# Patient Record
Sex: Female | Born: 1990
Health system: Southern US, Community
[De-identification: ages and names within clinical notes are randomized; demographics above are authoritative.]

## PROBLEM LIST (undated history)

## (undated) DIAGNOSIS — K219 Gastro-esophageal reflux disease without esophagitis: Secondary | ICD-10-CM

## (undated) DIAGNOSIS — F419 Anxiety disorder, unspecified: Secondary | ICD-10-CM

## (undated) HISTORY — PX: WISDOM TOOTH EXTRACTION: SHX21

## (undated) HISTORY — DX: Anxiety disorder, unspecified: F41.9

## (undated) HISTORY — PX: CHOLECYSTECTOMY: SHX55

## (undated) HISTORY — PX: ADENOIDECTOMY: SUR15

## (undated) HISTORY — PX: OTHER SURGICAL HISTORY: SHX169

## (undated) HISTORY — DX: Gastro-esophageal reflux disease without esophagitis: K21.9

---
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
## 2012-03-06 ENCOUNTER — Ambulatory Visit: Payer: Self-pay | Admitting: Family

## 2012-03-06 LAB — COMPREHENSIVE METABOLIC PANEL
Alkaline Phosphatase: 57 U/L — ABNORMAL LOW (ref 82–169)
Anion Gap: 5 — ABNORMAL LOW (ref 7–16)
Bilirubin,Total: 0.4 mg/dL (ref 0.2–1.0)
Calcium, Total: 9 mg/dL (ref 9.0–10.7)
Co2: 28 mmol/L (ref 21–32)
EGFR (African American): >60
Glucose: 75 mg/dL (ref 65–99)
Osmolality: 279 (ref 275–301)
Potassium: 3.8 mmol/L (ref 3.5–5.1)
SGOT(AST): 18 U/L (ref 0–26)
SGPT (ALT): 17 U/L (ref 12–78)
Total Protein: 8.1 g/dL (ref 6.4–8.6)

## 2012-03-06 LAB — CBC WITH DIFFERENTIAL/PLATELET
Basophil %: 0.5 %
Eosinophil #: 0.1 10*3/uL (ref 0.0–0.7)
Eosinophil %: 0.9 %
Lymphocyte #: 2.8 10*3/uL (ref 1.0–3.6)
Lymphocyte %: 36.8 %
MCH: 31.1 pg (ref 26.0–34.0)
MCHC: 34.1 g/dL (ref 32.0–36.0)
MCV: 91 fL (ref 80–100)
Neutrophil %: 56.5 %
RDW: 12.6 % (ref 11.5–14.5)
WBC: 7.5 10*3/uL (ref 3.6–11.0)

## 2012-03-18 ENCOUNTER — Encounter: Payer: Self-pay | Admitting: Gynecology

## 2012-03-19 ENCOUNTER — Ambulatory Visit (INDEPENDENT_AMBULATORY_CARE_PROVIDER_SITE_OTHER): Payer: BC Managed Care – PPO | Admitting: Gynecology

## 2012-03-19 ENCOUNTER — Encounter: Payer: Self-pay | Admitting: Gynecology

## 2012-03-19 VITALS — BP 110/60 | Ht 64.0 in | Wt 116.0 lb

## 2012-03-19 DIAGNOSIS — N946 Dysmenorrhea, unspecified: Secondary | ICD-10-CM

## 2012-03-19 DIAGNOSIS — K219 Gastro-esophageal reflux disease without esophagitis: Secondary | ICD-10-CM | POA: Insufficient documentation

## 2012-03-19 LAB — CBC WITH DIFFERENTIAL/PLATELET
Basophils Absolute: 0 10*3/uL (ref 0.0–0.1)
Basophils Relative: 0 % (ref 0–1)
Eosinophils Absolute: 0.1 10*3/uL (ref 0.0–0.7)
MCH: 30.3 pg (ref 26.0–34.0)
MCHC: 33.4 g/dL (ref 30.0–36.0)
Monocytes Relative: 6 % (ref 3–12)
Neutrophils Relative %: 56 % (ref 43–77)
Platelets: 278 10*3/uL (ref 150–400)
RDW: 13 % (ref 11.5–15.5)

## 2012-03-19 NOTE — Patient Instructions (Signed)
Follow up for ultrasound as scheduled 

## 2012-03-19 NOTE — Progress Notes (Signed)
Patient presents with her mother to discuss her dysmenorrhea. Has been having a lot of cramping with her menses which is leaving her bed bound. Has tried Advil does not seem to help. Has been on multiple different birth control pills per her history through school and again this does not seem to help with her cramping. Her menses are regular lasting 7 days but having a lot of help the cramping with them. No intermenstrual bleeding or intermenstrual pain. Otherwise doing well from a health standpoint. Was evaluated number of years ago for cramping with ultrasound 2010 normal. Remains virginal, does use tampons for her menses.  Exam with kim assistant Blood pressure 110/60 weight 116 height 5 foot 4 inches HEENT normal Lungs clear  Cardiac regular rate without rubs murmurs or gallops Breast exam deferred with reportedly no issues per patient  Abdomen soft nontender without masses guarding rebound organomegaly Spine straight without CVA tenderness Pelvic external BUS vagina normal. Cervix grossly normal. Uterus retroverted normal size midline mobile nontender. Adnexa without masses or tenderness. Rectovaginal exam normal.  Assessment and plan: 22 year old virgin with significant dysmenorrhea stable over time. No intermenstrual pain or bleeding. Has tried several different birth control pills without benefit. Recommend start with CBC and von Willebrand's panel given the moderate flow just to make sure although she has no history of easy bruisability or gums bleeding with brushing.  Check baseline ultrasound rule out ovarian process or ovarian endometriomas. Options for management reviewed to include more aggressive pain medicine, extended birth control pill cycles such as every 3-6 month withdrawal, progesterone only like Depo-Provera, Depo-Lupron, Mirena IUD or Skyla, diagnostic laparoscopy. The pros/cons of each choice reviewed. Patient will think of her options and follow up for the ultrasound and we'll  go from there.   Also reviewed her chart and she received 2 Gardasil vaccines and 2010. Options to receive the third now recognizing suboptimal interval or restart in the series discussed and offered. Patient wants to think and will follow up at her choice.

## 2012-03-21 LAB — VON WILLEBRAND PANEL: Coagulation Factor VIII: 111 % (ref 73–140)

## 2012-03-24 ENCOUNTER — Encounter: Payer: Self-pay | Admitting: Gynecology

## 2012-03-27 ENCOUNTER — Other Ambulatory Visit: Payer: BC Managed Care – PPO

## 2012-03-27 ENCOUNTER — Ambulatory Visit: Payer: BC Managed Care – PPO | Admitting: Gynecology

## 2012-04-01 ENCOUNTER — Ambulatory Visit (INDEPENDENT_AMBULATORY_CARE_PROVIDER_SITE_OTHER): Payer: BC Managed Care – PPO

## 2012-04-01 ENCOUNTER — Other Ambulatory Visit: Payer: Self-pay | Admitting: Gynecology

## 2012-04-01 ENCOUNTER — Encounter: Payer: Self-pay | Admitting: Gynecology

## 2012-04-01 ENCOUNTER — Ambulatory Visit (INDEPENDENT_AMBULATORY_CARE_PROVIDER_SITE_OTHER): Payer: BC Managed Care – PPO | Admitting: Gynecology

## 2012-04-01 DIAGNOSIS — B009 Herpesviral infection, unspecified: Secondary | ICD-10-CM

## 2012-04-01 DIAGNOSIS — N946 Dysmenorrhea, unspecified: Secondary | ICD-10-CM

## 2012-04-01 DIAGNOSIS — B001 Herpesviral vesicular dermatitis: Secondary | ICD-10-CM

## 2012-04-01 DIAGNOSIS — N8300 Follicular cyst of ovary, unspecified side: Secondary | ICD-10-CM

## 2012-04-01 MED ORDER — NORETHINDRONE ACET-ETHINYL EST 1-20 MG-MCG PO TABS: 1.0000 | ORAL_TABLET | Freq: Every day | ORAL | Status: DC

## 2012-04-01 MED ORDER — ACYCLOVIR 200 MG/5ML PO SUSP: 400.0000 mg | Freq: Every day | ORAL | Status: DC

## 2012-04-01 NOTE — Progress Notes (Signed)
Patient presents in followup for ultrasound. History of dysmenorrhea on oral contraceptives. Also with frequent recurrent cold sores.  Ultrasound shows uterus normal size and echotexture. Endometrial echo 9.3 mm. Right and left ovaries visualized and normal. Cul-de-sac negative.  Assessment and plan: 1. Dysmenorrhea. Many options reviewed with the patient as outlined in my 03/19/2012 note to include more aggressive pain medicine, extended birth control pill cycles such as every 3-6 month withdrawal, progesterone only like Depo-Provera, Depo-Lupron, Mirena IUD or Skyla, diagnostic laparoscopy.  Patient would like to try every 3 month withdrawal on oral contraceptives. She's unsure of which one she took before as she has been on multiple types some of which seemed to make her cold sores worse. We'll start with Loestrin 120 equivalents and see how she does with that. Yasmin type also discussed as a possibility. I reviewed possible increased risk of blood clots to include stroke heart attack DVT.  If Loestrin 120 do not work then we may want to try this. 2. Recurrent herpes labialis. Options for management reviewed. She cannot swallow big pills. Will try acyclovir 400 mg liquid daily to see if she doesn't suppress with this. She will stay on this for 6 months and we'll go from there.

## 2012-04-01 NOTE — Patient Instructions (Signed)
Start on birth control pills continuously with every three-month withdrawal. Call me for any issues with this. Start on acyclovir 2 teaspoons daily for cold sore suppression. Continue this daily and we'll see if this stops the frequent cold sores. Call me if you have any questions with this.

## 2012-04-24 ENCOUNTER — Emergency Department (HOSPITAL_COMMUNITY)
Admission: EM | Admit: 2012-04-24 | Discharge: 2012-04-24 | Disposition: A | Discharge status: Home / Self Care | Payer: BC Managed Care – PPO | Attending: Emergency Medicine | Admitting: Emergency Medicine

## 2012-04-24 DIAGNOSIS — S40012A Contusion of left shoulder, initial encounter: Secondary | ICD-10-CM

## 2012-04-24 DIAGNOSIS — J860 Pyothorax with fistula: Secondary | ICD-10-CM | POA: Insufficient documentation

## 2012-04-24 DIAGNOSIS — Y9289 Other specified places as the place of occurrence of the external cause: Secondary | ICD-10-CM | POA: Insufficient documentation

## 2012-04-24 DIAGNOSIS — Y9301 Activity, walking, marching and hiking: Secondary | ICD-10-CM | POA: Insufficient documentation

## 2012-04-24 DIAGNOSIS — IMO0002 Reserved for concepts with insufficient information to code with codable children: Secondary | ICD-10-CM | POA: Insufficient documentation

## 2012-04-24 DIAGNOSIS — S40019A Contusion of unspecified shoulder, initial encounter: Secondary | ICD-10-CM | POA: Insufficient documentation

## 2012-04-24 DIAGNOSIS — Z79899 Other long term (current) drug therapy: Secondary | ICD-10-CM | POA: Insufficient documentation

## 2012-04-24 MED ORDER — IBUPROFEN 100 MG/5ML PO SUSP
10.0000 mg/kg | Freq: Once | ORAL | Status: AC
  Administered 2012-04-24: 544 mg via ORAL
  Filled 2012-04-24: qty 30
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
## 2012-06-18 ENCOUNTER — Ambulatory Visit: Payer: Self-pay | Admitting: Gastroenterology

## 2012-06-24 DIAGNOSIS — K828 Other specified diseases of gallbladder: Secondary | ICD-10-CM | POA: Insufficient documentation

## 2012-07-06 ENCOUNTER — Telehealth: Payer: Self-pay | Admitting: *Deleted

## 2012-07-06 MED ORDER — DROSPIRENONE-ETHINYL ESTRADIOL 3-0.02 MG PO TABS: 1.0000 | ORAL_TABLET | Freq: Every day | ORAL | Status: DC

## 2012-07-06 NOTE — Telephone Encounter (Signed)
Recommend trial of Yaz. We discussed this previously with possible slight increased risk of stroke heart attack DVT. Refill times next annual see how she does with this.

## 2012-07-06 NOTE — Telephone Encounter (Signed)
Pt informed, rx sent 

## 2012-07-06 NOTE — Telephone Encounter (Signed)
Pt was given Rx for junel 1/20, she has taking for months now and said that it is causing her to have fever blisters. Pt was told to call and let you know if this should occur and new rx would be given. Please advise

## 2012-07-15 ENCOUNTER — Telehealth: Payer: Self-pay | Admitting: *Deleted

## 2012-07-15 MED ORDER — ACYCLOVIR 200 MG/5ML PO SUSP: 400.0000 mg | Freq: Every day | ORAL | Status: DC

## 2012-07-15 NOTE — Telephone Encounter (Signed)
Pt calling requesting refill on acyclovir 400 mg liquid daily, still having frequent outbreaks. Pt is up to date on annual, rx will be sent to pharmacy.

## 2012-07-28 ENCOUNTER — Telehealth: Payer: Self-pay | Admitting: *Deleted

## 2012-07-28 NOTE — Telephone Encounter (Signed)
PT CALLED TO FOLLOW UP WITH TELEPHONE ENCOUNTER 6//9/14. YAZ IS CAUSING FEVER BLISTER AS WELL, PT ASKED ANY OTHER OPTIONS FOR PILLS? PLEASE ADVISE

## 2012-07-28 NOTE — Telephone Encounter (Signed)
PT SAID SHE TAKES LIQUID ACYCLOVIR DAILY AND STILL THE SAME ISSUE. PT WOULD LIKE TO MAKE OV WITH TF TO DISCUSS OTHER OPTIONS. TRANSFERRED TO FRONT DESK TO SCHEDULE.

## 2012-07-28 NOTE — Telephone Encounter (Signed)
I am not sure that the pills are related to this. It would be an unusual side effect of the birth control pills. I'm a little reluctant to keep switching around. I would recommend staying on the acyclovir 400 mg daily as a suppression to see if we can keep her from having the cold sores.

## 2012-08-10 ENCOUNTER — Ambulatory Visit: Payer: Self-pay | Admitting: Gynecology

## 2012-08-17 ENCOUNTER — Ambulatory Visit (INDEPENDENT_AMBULATORY_CARE_PROVIDER_SITE_OTHER): Payer: BC Managed Care – PPO | Admitting: Gynecology

## 2012-08-17 ENCOUNTER — Encounter: Payer: Self-pay | Admitting: Gynecology

## 2012-08-17 DIAGNOSIS — B009 Herpesviral infection, unspecified: Secondary | ICD-10-CM

## 2012-08-17 DIAGNOSIS — B001 Herpesviral vesicular dermatitis: Secondary | ICD-10-CM

## 2012-08-17 DIAGNOSIS — Z309 Encounter for contraceptive management, unspecified: Secondary | ICD-10-CM

## 2012-08-17 MED ORDER — ACYCLOVIR 200 MG/5ML PO SUSP: 400.0000 mg | Freq: Every day | ORAL | Status: DC

## 2012-08-17 NOTE — Patient Instructions (Signed)
Try increasing the acyclovir to 2 teaspoons twice daily when you had the earliest onset of symptoms. We'll see if this does not suppress your cold sores.

## 2012-08-17 NOTE — Progress Notes (Signed)
Patient presents to discuss her recurrent herpes labialis. Had started on Loestrin 120 menstrual suppression for dysmenorrhea/menorrhagia and has done well but noted an increased rate of cold sores. We switched her to Dianah Field but she continues to have monthly outbreaks. She is having a great response though from a menstrual suppression standpoint is doing well. I reviewed options to include switching regiments such as Depo-Provera/nexplanon/Depo-Lupron/IUD. The pros/cons, risks/benefits of each choice reviewed in her particular situation.  At this point since she has had such a great response to the oral contraceptives we'll continue with the Yaz and adjust her acyclovir. Options to increase to 600 mg daily or stay at 400 mg daily suppression and increased to 800 mg with earliest signs of outbreak discussed. She is going to try continuing the 400 mg daily and go to 800 mg at the earliest sign of outbreak for several days. This gives her an abortive acceptable outbreak and we'll monitor. If not then we may go up to 600 mg daily possible adjust her pills to a 30-35 mcg dose. Patient will follow up with me as needed.

## 2012-11-25 ENCOUNTER — Other Ambulatory Visit: Payer: Self-pay | Admitting: Gynecology

## 2013-01-12 ENCOUNTER — Telehealth: Payer: Self-pay | Admitting: *Deleted

## 2013-01-12 NOTE — Telephone Encounter (Signed)
Pt is currently taking generic yaz continuously x3 months c/o having mood swings & anxious between 2nd and 3rd pack. Other than mood swings pill are working great. She would like to keep taking pill every 3 months, but asked if there is something she could take for this? Any recommendations? Please advise

## 2013-01-12 NOTE — Telephone Encounter (Signed)
Pt informed with the below note, pt will try withdrawal every other month.

## 2013-01-12 NOTE — Telephone Encounter (Signed)
Options: 1. Put up with it 2. Having every other month withdrawal to see if this does eliminate the symptoms 3. Try different low-dose birth control pill and continue with every 3 month withdrawal 4. Do not feel adding an anxiolytic for this indication appropriate.

## 2013-02-26 ENCOUNTER — Telehealth: Payer: Self-pay | Admitting: *Deleted

## 2013-02-26 NOTE — Telephone Encounter (Signed)
Pt is currently on Yaz takes 2 packs continuously and then on 3rd pack have cycle. Pt has a terrible cold taking amoxicillin, allegra, takes Zoloft as well. Pt is in her 2nd pack of pill first week and cycle has started has not missed any pills. Asked if she should just continue taking pills? Could this be due to all the medication she is taking? Please advise

## 2013-02-26 NOTE — Telephone Encounter (Signed)
Possibly due to antibiotic. Regardless recommend stopping now and restart next Sunday. Backup contraception over the next cycle at least. Should be using condoms regardless to help decrease STD risk.

## 2013-02-26 NOTE — Telephone Encounter (Signed)
Pt informed with the below note. 

## 2013-04-01 ENCOUNTER — Encounter: Payer: Self-pay | Admitting: Gynecology

## 2013-04-01 ENCOUNTER — Ambulatory Visit (INDEPENDENT_AMBULATORY_CARE_PROVIDER_SITE_OTHER): Payer: BC Managed Care – PPO | Admitting: Gynecology

## 2013-04-01 DIAGNOSIS — N926 Irregular menstruation, unspecified: Secondary | ICD-10-CM

## 2013-04-01 DIAGNOSIS — N898 Other specified noninflammatory disorders of vagina: Secondary | ICD-10-CM

## 2013-04-01 LAB — WET PREP FOR TRICH, YEAST, CLUE: Trich, Wet Prep: NONE SEEN

## 2013-04-01 MED ORDER — FLUCONAZOLE 150 MG PO TABS: 150.0000 mg | ORAL_TABLET | Freq: Once | ORAL | Status: DC

## 2013-04-01 NOTE — Addendum Note (Signed)
Addended by: Richardson ChiquitoWILKINSON, KARI S on: 04/01/2013 03:00 PM   Modules accepted: Orders

## 2013-04-01 NOTE — Progress Notes (Signed)
Nicole PeperJoanna R Richardson [birth year] [id number]        23 y.o.  G0P0 presents complaining of irregular menses. Patient has been doing well with regular monthly menses up until about a month ago when she started to have some breakthrough bleeding midcycle, stopped her pills had a flow we started them had another episode of breakthrough bleeding and she stopped her pills again such that now she's having periods every 2 weeks. Not having any pain. Nothing else has changed. Was seen earlier this year with recurrent HSV which seems to have resolved with much less frequent episodes. Currently on Yaz BCPs. Virginal using BCPs for menstrual regulation.  Past medical history,surgical history, problem list, medications, allergies, family history and social history were all reviewed and documented in the EPIC chart.  Exam: [facility] assistant General appearance  Normal External BUS vagina with cakey white discharge. Cervix normal. Uterus normal size midline mobile nontender. Adnexa without masses or tenderness.  Assessment/Plan:  23 y.o. G0P0  1. Irregular menses due to irregular pill use. Recommended patient continue on BCPs now and not stop them if she has breakthrough bleeding. Allow 2 cycles and if she still has irregular bleeding that the call and will make a pill dose adjustment. Assuming she does well then she'll followup when she is due for her annual exam. 2. Vaginal discharge. Patient's asymptomatic but clinically as well as on wet prep consistent with yeast. There are some clue cells but I think this is a result of her yeast and we'll go ahead and treat this alone and see if she does well with this. Diflucan 150 mg x1 dose.   Note: This document was prepared with digital dictation and possible smart phrase technology. Any transcriptional errors that result from this process are unintentional.   Dara LordsFONTAINE,Carolynne Schuchard P MD, 10:22 AM 04/01/2013

## 2013-04-01 NOTE — Patient Instructions (Signed)
Take Diflucan pill once. Continue on your birth control pills as we discussed. If irregular bleeding continues then call me. Otherwise, Followup when you're due for your annual exam

## 2013-05-25 ENCOUNTER — Other Ambulatory Visit: Payer: Self-pay | Admitting: Gynecology

## 2013-06-18 ENCOUNTER — Other Ambulatory Visit: Payer: Self-pay | Admitting: Gynecology

## 2013-09-01 ENCOUNTER — Other Ambulatory Visit: Payer: Self-pay | Admitting: Gynecology

## 2013-10-23 ENCOUNTER — Other Ambulatory Visit: Payer: Self-pay | Admitting: Gynecology

## 2013-11-24 ENCOUNTER — Other Ambulatory Visit: Payer: Self-pay | Admitting: Gynecology

## 2013-12-03 ENCOUNTER — Encounter: Payer: BC Managed Care – PPO | Admitting: Gynecology

## 2014-01-04 ENCOUNTER — Other Ambulatory Visit: Payer: Self-pay | Admitting: Gynecology

## 2014-02-02 ENCOUNTER — Other Ambulatory Visit (HOSPITAL_COMMUNITY)
Admission: RE | Admit: 2014-02-02 | Discharge: 2014-02-02 | Disposition: A | Discharge status: Home / Self Care | Payer: BLUE CROSS/BLUE SHIELD | Source: Ambulatory Visit | Attending: Gynecology | Admitting: Gynecology

## 2014-02-02 ENCOUNTER — Encounter: Payer: Self-pay | Admitting: Gynecology

## 2014-02-02 ENCOUNTER — Ambulatory Visit (INDEPENDENT_AMBULATORY_CARE_PROVIDER_SITE_OTHER): Payer: BLUE CROSS/BLUE SHIELD | Admitting: Gynecology

## 2014-02-02 VITALS — BP 112/60 | Ht 64.0 in | Wt 120.0 lb

## 2014-02-02 DIAGNOSIS — Z01419 Encounter for gynecological examination (general) (routine) without abnormal findings: Secondary | ICD-10-CM | POA: Insufficient documentation

## 2014-02-02 LAB — CBC WITH DIFFERENTIAL/PLATELET
Basophils Absolute: 0.1 10*3/uL (ref 0.0–0.1)
Basophils Relative: 1 % (ref 0–1)
EOS ABS: 0.1 10*3/uL (ref 0.0–0.7)
Eosinophils Relative: 1 % (ref 0–5)
HCT: 36.5 % (ref 36.0–46.0)
HEMOGLOBIN: 12.2 g/dL (ref 12.0–15.0)
LYMPHS ABS: 2.5 10*3/uL (ref 0.7–4.0)
LYMPHS PCT: 41 % (ref 12–46)
MCH: 30.3 pg (ref 26.0–34.0)
MCHC: 33.4 g/dL (ref 30.0–36.0)
MCV: 90.6 fL (ref 78.0–100.0)
MONO ABS: 0.4 10*3/uL (ref 0.1–1.0)
MPV: 8.7 fL (ref 8.6–12.4)
Monocytes Relative: 6 % (ref 3–12)
NEUTROS PCT: 51 % (ref 43–77)
Neutro Abs: 3.1 10*3/uL (ref 1.7–7.7)
PLATELETS: 321 10*3/uL (ref 150–400)
RBC: 4.03 MIL/uL (ref 3.87–5.11)
RDW: 12.9 % (ref 11.5–15.5)
WBC: 6.1 10*3/uL (ref 4.0–10.5)

## 2014-02-02 MED ORDER — DROSPIRENONE-ETHINYL ESTRADIOL 3-0.02 MG PO TABS: ORAL_TABLET | ORAL | Status: DC

## 2014-02-02 NOTE — Progress Notes (Signed)
Nicole PeperJoanna R Richardson Feb 17, 1990 [id number]        24 y.o.  G0P0 for annual exam.  Doing well without complaints.  Past medical history,surgical history, problem list, medications, allergies, family history and social history were all reviewed and documented as reviewed in the EPIC chart.  ROS:  Performed with pertinent positives and negatives included in the history, assessment and plan.   Additional significant findings :  none   Exam: [facility] personassistant Filed Vitals:   02/02/14 1031  BP: 112/60  Height: 5\' 4"  (1.626 m)  Weight: 120 lb (54.432 kg)   General appearance:  Normal affect, orientation and appearance. Skin: Grossly normal HEENT: Without gross lesions.  No cervical or supraclavicular adenopathy. Thyroid normal.  Lungs:  Clear without wheezing, rales or rhonchi Cardiac: RR, without RMG Abdominal:  Soft, nontender, without masses, guarding, rebound, organomegaly or hernia Breasts:  Examined lying and sitting without masses, retractions, discharge or axillary adenopathy. Pelvic:  Ext/BUS/vagina normal  Cervix normal. Pap done. Tail end of menses spotting noted  Uterus anteverted, normal size, shape and contour, midline and mobile nontender   Adnexa  Without masses or tenderness    Anus and perineum  Normal     Assessment/Plan:  24 y.o. G0P0 female for annual exam with regular menses, oral contraceptives.   1. Oral contraceptives. Patient doing well with Yaz equivalent having every 3 month withdrawal. Wants to continue.  Slight increased risk of thrombosis associated with drospirenone reviewed. Refill 1 year provided. 2. STD screening. Patient remains virginal.  Condoms when become sexually active regardless of BCPs. 3. Gardasil series. Patient reports having 1 or 2 shots years ago. Strongly recommended she reinitiate the series. 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 4. Breast health. SBE monthly reviewed. 5. Pap smear. First Pap smear done  today she has turned 23. 6. Health maintenance. Baseline CBC urinalysis done.  Follow up in one year, sooner as needed.     Dara LordsFONTAINE,Fredi Hurtado P MD, 11:04 AM 02/02/2014

## 2014-02-02 NOTE — Addendum Note (Signed)
Addended by: Dayna BarkerGARDNER, Karah Caruthers K on: 02/02/2014 11:34 AM   Modules accepted: Orders, SmartSet

## 2014-02-02 NOTE — Patient Instructions (Addendum)
Make arrangements for the Gardasil HPV vaccine series through school.  You may obtain a copy of any labs that were done today by logging onto MyChart as outlined in the instructions provided with your AVS (after visit summary). The office will not call with normal lab results but certainly if there are any significant abnormalities then we will contact you.   Health Maintenance, Female A healthy lifestyle and preventative care can promote health and wellness.  Maintain regular health, dental, and eye exams.  Eat a healthy diet. Foods like vegetables, fruits, whole grains, low-fat dairy products, and lean protein foods contain the nutrients you need without too many calories. Decrease your intake of foods high in solid fats, added sugars, and salt. Get information about a proper diet from your caregiver, if necessary.  Regular physical exercise is one of the most important things you can do for your health. Most adults should get at least 150 minutes of moderate-intensity exercise (any activity that increases your heart rate and causes you to sweat) each week. In addition, most adults need muscle-strengthening exercises on 2 or more days a week.   Maintain a healthy weight. The body mass index (BMI) is a screening tool to identify possible weight problems. It provides an estimate of body fat based on height and weight. Your caregiver can help determine your BMI, and can help you achieve or maintain a healthy weight. For adults 20 years and older:  A BMI below 18.5 is considered underweight.  A BMI of 18.5 to 24.9 is normal.  A BMI of 25 to 29.9 is considered overweight.  A BMI of 30 and above is considered obese.  Maintain normal blood lipids and cholesterol by exercising and minimizing your intake of saturated fat. Eat a balanced diet with plenty of fruits and vegetables. Blood tests for lipids and cholesterol should begin at age 58 and be repeated every 5 years. If your lipid or cholesterol  levels are high, you are over 50, or you are a high risk for heart disease, you may need your cholesterol levels checked more frequently.Ongoing high lipid and cholesterol levels should be treated with medicines if diet and exercise are not effective.  If you smoke, find out from your caregiver how to quit. If you do not use tobacco, do not start.  Lung cancer screening is recommended for adults aged 67 80 years who are at high risk for developing lung cancer because of a history of smoking. Yearly low-dose computed tomography (CT) is recommended for people who have at least a 30-pack-year history of smoking and are a current smoker or have quit within the past 15 years. A pack year of smoking is smoking an average of 1 pack of cigarettes a day for 1 year (for example: 1 pack a day for 30 years or 2 packs a day for 15 years). Yearly screening should continue until the smoker has stopped smoking for at least 15 years. Yearly screening should also be stopped for people who develop a health problem that would prevent them from having lung cancer treatment.  If you are pregnant, do not drink alcohol. If you are breastfeeding, be very cautious about drinking alcohol. If you are not pregnant and choose to drink alcohol, do not exceed 1 drink per day. One drink is considered to be 12 ounces (355 mL) of beer, 5 ounces (148 mL) of wine, or 1.5 ounces (44 mL) of liquor.  Avoid use of street drugs. Do not share needles with anyone.  Ask for help if you need support or instructions about stopping the use of drugs.  High blood pressure causes heart disease and increases the risk of stroke. Blood pressure should be checked at least every 1 to 2 years. Ongoing high blood pressure should be treated with medicines, if weight loss and exercise are not effective.  If you are 75 to 22 years old, ask your caregiver if you should take aspirin to prevent strokes.  Diabetes screening involves taking a blood sample to check  your fasting blood sugar level. This should be done once every 3 years, after age 24, if you are within normal weight and without risk factors for diabetes. Testing should be considered at a younger age or be carried out more frequently if you are overweight and have at least 1 risk factor for diabetes.  Breast cancer screening is essential preventative care for women. You should practice "breast self-awareness." This means understanding the normal appearance and feel of your breasts and may include breast self-examination. Any changes detected, no matter how small, should be reported to a caregiver. Women in their 80s and 30s should have a clinical breast exam (CBE) by a caregiver as part of a regular health exam every 1 to 3 years. After age 44, women should have a CBE every year. Starting at age 29, women should consider having a mammogram (breast X-ray) every year. Women who have a family history of breast cancer should talk to their caregiver about genetic screening. Women at a high risk of breast cancer should talk to their caregiver about having an MRI and a mammogram every year.  Breast cancer gene (BRCA)-related cancer risk assessment is recommended for women who have family members with BRCA-related cancers. BRCA-related cancers include breast, ovarian, tubal, and peritoneal cancers. Having family members with these cancers may be associated with an increased risk for harmful changes (mutations) in the breast cancer genes BRCA1 and BRCA2. Results of the assessment will determine the need for genetic counseling and BRCA1 and BRCA2 testing.  The Pap test is a screening test for cervical cancer. Women should have a Pap test starting at age 33. Between ages 66 and 93, Pap tests should be repeated every 2 years. Beginning at age 17, you should have a Pap test every 3 years as long as the past 3 Pap tests have been normal. If you had a hysterectomy for a problem that was not cancer or a condition that  could lead to cancer, then you no longer need Pap tests. If you are between ages 31 and 53, and you have had normal Pap tests going back 10 years, you no longer need Pap tests. If you have had past treatment for cervical cancer or a condition that could lead to cancer, you need Pap tests and screening for cancer for at least 20 years after your treatment. If Pap tests have been discontinued, risk factors (such as a new sexual partner) need to be reassessed to determine if screening should be resumed. Some women have medical problems that increase the chance of getting cervical cancer. In these cases, your caregiver may recommend more frequent screening and Pap tests.  The human papillomavirus (HPV) test is an additional test that may be used for cervical cancer screening. The HPV test looks for the virus that can cause the cell changes on the cervix. The cells collected during the Pap test can be tested for HPV. The HPV test could be used to screen women aged 95 years  and older, and should be used in women of any age who have unclear Pap test results. After the age of 30, women should have HPV testing at the same frequency as a Pap test.  Colorectal cancer can be detected and often prevented. Most routine colorectal cancer screening begins at the age of 77 and continues through age 49. However, your caregiver may recommend screening at an earlier age if you have risk factors for colon cancer. On a yearly basis, your caregiver may provide home test kits to check for hidden blood in the stool. Use of a small camera at the end of a tube, to directly examine the colon (sigmoidoscopy or colonoscopy), can detect the earliest forms of colorectal cancer. Talk to your caregiver about this at age 45, when routine screening begins. Direct examination of the colon should be repeated every 5 to 10 years through age 28, unless early forms of pre-cancerous polyps or small growths are found.  Hepatitis C blood testing is  recommended for all people born from [birth year] through 1963 and any individual with known risks for hepatitis C.  Practice safe sex. Use condoms and avoid high-risk sexual practices to reduce the spread of sexually transmitted infections (STIs). Sexually active women aged 73 and younger should be checked for Chlamydia, which is a common sexually transmitted infection. Older women with new or multiple partners should also be tested for Chlamydia. Testing for other STIs is recommended if you are sexually active and at increased risk.  Osteoporosis is a disease in which the bones lose minerals and strength with aging. This can result in serious bone fractures. The risk of osteoporosis can be identified using a bone density scan. Women ages 71 and over and women at risk for fractures or osteoporosis should discuss screening with their caregivers. Ask your caregiver whether you should be taking a calcium supplement or vitamin D to reduce the rate of osteoporosis.  Menopause can be associated with physical symptoms and risks. Hormone replacement therapy is available to decrease symptoms and risks. You should talk to your caregiver about whether hormone replacement therapy is right for you.  Use sunscreen. Apply sunscreen liberally and repeatedly throughout the day. You should seek shade when your shadow is shorter than you. Protect yourself by wearing long sleeves, pants, a wide-brimmed hat, and sunglasses year round, whenever you are outdoors.  Notify your caregiver of new moles or changes in moles, especially if there is a change in shape or color. Also notify your caregiver if a mole is larger than the size of a pencil eraser.  Stay current with your immunizations. Document Released: 07/30/2010 Document Revised: 05/11/2012 Document Reviewed: 07/30/2010 [facility] Patient Information 2014 [place].

## 2014-02-03 LAB — CYTOLOGY - PAP

## 2014-02-03 LAB — URINALYSIS W MICROSCOPIC + REFLEX CULTURE
BILIRUBIN URINE: NEGATIVE
CASTS: NONE SEEN
CRYSTALS: NONE SEEN
GLUCOSE, UA: NEGATIVE mg/dL
Nitrite: NEGATIVE
PH: 5.5 (ref 5.0–8.0)
Protein, ur: NEGATIVE mg/dL
Specific Gravity, Urine: 1.029 (ref 1.005–1.030)
Urobilinogen, UA: 0.2 mg/dL (ref 0.0–1.0)

## 2014-02-04 ENCOUNTER — Other Ambulatory Visit: Payer: Self-pay | Admitting: Gynecology

## 2014-02-04 LAB — URINE CULTURE

## 2014-02-04 MED ORDER — FLUCONAZOLE 150 MG PO TABS: 150.0000 mg | ORAL_TABLET | Freq: Once | ORAL | Status: DC

## 2014-02-21 ENCOUNTER — Other Ambulatory Visit: Payer: Self-pay | Admitting: Gynecology

## 2015-03-29 ENCOUNTER — Other Ambulatory Visit: Payer: Self-pay | Admitting: Gynecology

## 2015-05-03 ENCOUNTER — Other Ambulatory Visit: Payer: Self-pay | Admitting: Gynecology

## 2015-05-03 ENCOUNTER — Other Ambulatory Visit: Payer: Self-pay

## 2015-05-03 MED ORDER — DROSPIRENONE-ETHINYL ESTRADIOL 3-0.02 MG PO TABS: ORAL_TABLET | ORAL | Status: DC

## 2015-05-03 NOTE — Telephone Encounter (Signed)
Patient called needing refill on birth control pill. Refill sent since she has her CE scheduled for Jun 28, 2015. Her last CE was Jan 2016.  She will need two refills to get her until appt. Ok?

## 2015-06-28 ENCOUNTER — Ambulatory Visit (INDEPENDENT_AMBULATORY_CARE_PROVIDER_SITE_OTHER): Payer: BLUE CROSS/BLUE SHIELD | Admitting: Gynecology

## 2015-06-28 ENCOUNTER — Encounter: Payer: Self-pay | Admitting: Gynecology

## 2015-06-28 VITALS — BP 114/70 | Ht 65.0 in | Wt 121.0 lb

## 2015-06-28 DIAGNOSIS — Z01419 Encounter for gynecological examination (general) (routine) without abnormal findings: Secondary | ICD-10-CM

## 2015-06-28 DIAGNOSIS — Z23 Encounter for immunization: Secondary | ICD-10-CM | POA: Diagnosis not present

## 2015-06-28 LAB — CBC WITH DIFFERENTIAL/PLATELET
BASOS ABS: 79 {cells}/uL (ref 0–200)
Basophils Relative: 1 %
EOS ABS: 79 {cells}/uL (ref 15–500)
Eosinophils Relative: 1 %
HCT: 36.1 % (ref 35.0–45.0)
Hemoglobin: 12 g/dL (ref 11.7–15.5)
LYMPHS PCT: 43 %
Lymphs Abs: 3397 cells/uL (ref 850–3900)
MCH: 29.8 pg (ref 27.0–33.0)
MCHC: 33.2 g/dL (ref 32.0–36.0)
MCV: 89.6 fL (ref 80.0–100.0)
MONOS PCT: 5 %
MPV: 9 fL (ref 7.5–12.5)
Monocytes Absolute: 395 cells/uL (ref 200–950)
NEUTROS PCT: 50 %
Neutro Abs: 3950 cells/uL (ref 1500–7800)
Platelets: 340 10*3/uL (ref 140–400)
RBC: 4.03 MIL/uL (ref 3.80–5.10)
RDW: 12.7 % (ref 11.0–15.0)
WBC: 7.9 10*3/uL (ref 3.8–10.8)

## 2015-06-28 MED ORDER — DROSPIRENONE-ETHINYL ESTRADIOL 3-0.02 MG PO TABS: ORAL_TABLET | ORAL | Status: DC

## 2015-06-28 NOTE — Addendum Note (Signed)
Addended by: Dayna BarkerGARDNER, Perla Echavarria K on: 06/28/2015 03:19 PM   Modules accepted: Orders, SmartSet

## 2015-06-28 NOTE — Patient Instructions (Signed)
Follow up in 2 months for your next Gardasil injection and then in 6 months for your final injection  You may obtain a copy of any labs that were done today by logging onto MyChart as outlined in the instructions provided with your AVS (after visit summary). The office will not call with normal lab results but certainly if there are any significant abnormalities then we will contact you.   Health Maintenance Adopting a healthy lifestyle and getting preventive care can go a long way to promote health and wellness. Talk with your health care provider about what schedule of regular examinations is right for you. This is a good chance for you to check in with your provider about disease prevention and staying healthy. In between checkups, there are plenty of things you can do on your own. Experts have done a lot of research about which lifestyle changes and preventive measures are most likely to keep you healthy. Ask your health care provider for more information. WEIGHT AND DIET  Eat a healthy diet  Be sure to include plenty of vegetables, fruits, low-fat dairy products, and lean protein.  Do not eat a lot of foods high in solid fats, added sugars, or salt.  Get regular exercise. This is one of the most important things you can do for your health.  Most adults should exercise for at least 150 minutes each week. The exercise should increase your heart rate and make you sweat (moderate-intensity exercise).  Most adults should also do strengthening exercises at least twice a week. This is in addition to the moderate-intensity exercise.  Maintain a healthy weight  Body mass index (BMI) is a measurement that can be used to identify possible weight problems. It estimates body fat based on height and weight. Your health care provider can help determine your BMI and help you achieve or maintain a healthy weight.  For females 20 years of age and older:   A BMI below 18.5 is considered  underweight.  A BMI of 18.5 to 24.9 is normal.  A BMI of 25 to 29.9 is considered overweight.  A BMI of 30 and above is considered obese.  Watch levels of cholesterol and blood lipids  You should start having your blood tested for lipids and cholesterol at 23 years of age, then have this test every 5 years.  You may need to have your cholesterol levels checked more often if:  Your lipid or cholesterol levels are high.  You are older than 23 years of age.  You are at high risk for heart disease.  CANCER SCREENING   Lung Cancer  Lung cancer screening is recommended for adults 55-80 years old who are at high risk for lung cancer because of a history of smoking.  A yearly low-dose CT scan of the lungs is recommended for people who:  Currently smoke.  Have quit within the past 15 years.  Have at least a 30-pack-year history of smoking. A pack year is smoking an average of one pack of cigarettes a day for 1 year.  Yearly screening should continue until it has been 15 years since you quit.  Yearly screening should stop if you develop a health problem that would prevent you from having lung cancer treatment.  Breast Cancer  Practice breast self-awareness. This means understanding how your breasts normally appear and feel.  It also means doing regular breast self-exams. Let your health care provider know about any changes, no matter how small.  If you are   in your 30s or 30s, you should have a clinical breast exam (CBE) by a health care provider every 1-3 years as part of a regular health exam.  If you are 97 or older, have a CBE every year. Also consider having a breast X-ray (mammogram) every year.  If you have a family history of breast cancer, talk to your health care provider about genetic screening.  If you are at high risk for breast cancer, talk to your health care provider about having an MRI and a mammogram every year.  Breast cancer gene (BRCA) assessment is  recommended for women who have family members with BRCA-related cancers. BRCA-related cancers include:  Breast.  Ovarian.  Tubal.  Peritoneal cancers.  Results of the assessment will determine the need for genetic counseling and BRCA1 and BRCA2 testing. Cervical Cancer Routine pelvic examinations to screen for cervical cancer are no longer recommended for nonpregnant women who are considered low risk for cancer of the pelvic organs (ovaries, uterus, and vagina) and who do not have symptoms. A pelvic examination may be necessary if you have symptoms including those associated with pelvic infections. Ask your health care provider if a screening pelvic exam is right for you.   The Pap test is the screening test for cervical cancer for women who are considered at risk.  If you had a hysterectomy for a problem that was not cancer or a condition that could lead to cancer, then you no longer need Pap tests.  If you are older than 65 years, and you have had normal Pap tests for the past 10 years, you no longer need to have Pap tests.  If you have had past treatment for cervical cancer or a condition that could lead to cancer, you need Pap tests and screening for cancer for at least 20 years after your treatment.  If you no longer get a Pap test, assess your risk factors if they change (such as having a new sexual partner). This can affect whether you should start being screened again.  Some women have medical problems that increase their chance of getting cervical cancer. If this is the case for you, your health care provider may recommend more frequent screening and Pap tests.  The human papillomavirus (HPV) test is another test that may be used for cervical cancer screening. The HPV test looks for the virus that can cause cell changes in the cervix. The cells collected during the Pap test can be tested for HPV.  The HPV test can be used to screen women 21 years of age and older. Getting tested  for HPV can extend the interval between normal Pap tests from three to five years.  An HPV test also should be used to screen women of any age who have unclear Pap test results.  After 23 years of age, women should have HPV testing as often as Pap tests.  Colorectal Cancer  This type of cancer can be detected and often prevented.  Routine colorectal cancer screening usually begins at 23 years of age and continues through 23 years of age.  Your health care provider may recommend screening at an earlier age if you have risk factors for colon cancer.  Your health care provider may also recommend using home test kits to check for hidden blood in the stool.  A small camera at the end of a tube can be used to examine your colon directly (sigmoidoscopy or colonoscopy). This is done to check for the earliest forms  of colorectal cancer.  Routine screening usually begins at age 46.  Direct examination of the colon should be repeated every 5-10 years through 23 years of age. However, you may need to be screened more often if early forms of precancerous polyps or small growths are found. Skin Cancer  Check your skin from head to toe regularly.  Tell your health care provider about any new moles or changes in moles, especially if there is a change in a mole's shape or color.  Also tell your health care provider if you have a mole that is larger than the size of a pencil eraser.  Always use sunscreen. Apply sunscreen liberally and repeatedly throughout the day.  Protect yourself by wearing long sleeves, pants, a wide-brimmed hat, and sunglasses whenever you are outside. HEART DISEASE, DIABETES, AND HIGH BLOOD PRESSURE   Have your blood pressure checked at least every 1-2 years. High blood pressure causes heart disease and increases the risk of stroke.  If you are between 55 years and 18 years old, ask your health care provider if you should take aspirin to prevent strokes.  Have regular  diabetes screenings. This involves taking a blood sample to check your fasting blood sugar level.  If you are at a normal weight and have a low risk for diabetes, have this test once every three years after 23 years of age.  If you are overweight and have a high risk for diabetes, consider being tested at a younger age or more often. PREVENTING INFECTION  Hepatitis B  If you have a higher risk for hepatitis B, you should be screened for this virus. You are considered at high risk for hepatitis B if:  You were born in a country where hepatitis B is common. Ask your health care provider which countries are considered high risk.  Your parents were born in a high-risk country, and you have not been immunized against hepatitis B (hepatitis B vaccine).  You have HIV or AIDS.  You use needles to inject street drugs.  You live with someone who has hepatitis B.  You have had sex with someone who has hepatitis B.  You get hemodialysis treatment.  You take certain medicines for conditions, including cancer, organ transplantation, and autoimmune conditions. Hepatitis C  Blood testing is recommended for:  Everyone born from [birth year] through 1963.  Anyone with known risk factors for hepatitis C. Sexually transmitted infections (STIs)  You should be screened for sexually transmitted infections (STIs) including gonorrhea and chlamydia if:  You are sexually active and are younger than 23 years of age.  You are older than 23 years of age and your health care provider tells you that you are at risk for this type of infection.  Your sexual activity has changed since you were last screened and you are at an increased risk for chlamydia or gonorrhea. Ask your health care provider if you are at risk.  If you do not have HIV, but are at risk, it may be recommended that you take a prescription medicine daily to prevent HIV infection. This is called pre-exposure prophylaxis (PrEP). You are considered at  risk if:  You are sexually active and do not regularly use condoms or know the HIV status of your partner(s).  You take drugs by injection.  You are sexually active with a partner who has HIV. Talk with your health care provider about whether you are at high risk of being infected with HIV. If you choose to begin  PrEP, you should first be tested for HIV. You should then be tested every 3 months for as long as you are taking PrEP.  PREGNANCY   If you are premenopausal and you may become pregnant, ask your health care provider about preconception counseling.  If you may become pregnant, take 400 to 800 micrograms (mcg) of folic acid every day.  If you want to prevent pregnancy, talk to your health care provider about birth control (contraception). OSTEOPOROSIS AND MENOPAUSE   Osteoporosis is a disease in which the bones lose minerals and strength with aging. This can result in serious bone fractures. Your risk for osteoporosis can be identified using a bone density scan.  If you are 65 years of age or older, or if you are at risk for osteoporosis and fractures, ask your health care provider if you should be screened.  Ask your health care provider whether you should take a calcium or vitamin D supplement to lower your risk for osteoporosis.  Menopause may have certain physical symptoms and risks.  Hormone replacement therapy may reduce some of these symptoms and risks. Talk to your health care provider about whether hormone replacement therapy is right for you.  HOME CARE INSTRUCTIONS   Schedule regular health, dental, and eye exams.  Stay current with your immunizations.   Do not use any tobacco products including cigarettes, chewing tobacco, or electronic cigarettes.  If you are pregnant, do not drink alcohol.  If you are breastfeeding, limit how much and how often you drink alcohol.  Limit alcohol intake to no more than 1 drink per day for nonpregnant women. One drink equals  12 ounces of beer, 5 ounces of wine, or 1 ounces of hard liquor.  Do not use street drugs.  Do not share needles.  Ask your health care provider for help if you need support or information about quitting drugs.  Tell your health care provider if you often feel depressed.  Tell your health care provider if you have ever been abused or do not feel safe at home. Document Released: 07/30/2010 Document Revised: 05/31/2013 Document Reviewed: 12/16/2012 [employer] Patient Information 2015 [employer]. This information is not intended to replace advice given to you by your health care provider. Make sure you discuss any questions you have with your health care provider.    

## 2015-06-28 NOTE — Progress Notes (Signed)
    Vance PeperJoanna R Michael February 25, 1990 [id number]        25 y.o.  G0P0  for annual exam.  Doing well without complaints  Past medical history,surgical history, problem list, medications, allergies, family history and social history were all reviewed and documented as reviewed in the EPIC chart.  ROS:  Performed with pertinent positives and negatives included in the history, assessment and plan.   Additional significant findings :  none   Exam: Kennon PortelaKim Gardner assistant Filed Vitals:   06/28/15 1422  BP: 114/70  Height: 5\' 5"  (1.651 m)  Weight: 121 lb (54.885 kg)   General appearance:  Normal affect, orientation and appearance. Skin: Grossly normal HEENT: Without gross lesions.  No cervical or supraclavicular adenopathy. Thyroid normal.  Lungs:  Clear without wheezing, rales or rhonchi Cardiac: RR, without RMG Abdominal:  Soft, nontender, without masses, guarding, rebound, organomegaly or hernia Breasts:  Examined lying and sitting without masses, retractions, discharge or axillary adenopathy. Pelvic:  Ext/BUS/vagina normal  Cervix normal  Uterus retroverted, normal size, shape and contour, midline and mobile nontender   Adnexa without masses or tenderness    Anus and perineum normal     Assessment/Plan:  25 y.o. G0P0 female for annual exam with regular menses, oral contraceptives.   1. Oral contraceptives.  Continues on Yaz equivalents and wants to refill. Doing everything month withdrawal. Have discussed possible increased risk of thrombosis and accepted. Refill 1 year provided. 2. Gardasil series again rediscussed and she wants to go ahead and initiate. First injection today with follow up in 2 months. Need to return in 6 months stressed. 3. STD screening discussed. Patient remains virginal and declines. 4. Breast health. SBE month reviewed. 5. Pap smear 2016. No Pap smear done today. Plan repeat at 3 year interval per current screening guidelines. 6. Health maintenance. CBC and  urinalysis ordered. Follow up 2 months for her next Gardasil and 6 months ultimately. Otherwise follow up in one year for annual exam   Dara LordsFONTAINE,Deserai Cansler P MD, 2:57 PM 06/28/2015

## 2015-06-29 LAB — URINALYSIS W MICROSCOPIC + REFLEX CULTURE
BILIRUBIN URINE: NEGATIVE
Casts: NONE SEEN [LPF]
Crystals: NONE SEEN [HPF]
GLUCOSE, UA: NEGATIVE
HGB URINE DIPSTICK: NEGATIVE
Ketones, ur: NEGATIVE
Nitrite: NEGATIVE
PH: 7.5 (ref 5.0–8.0)
PROTEIN: NEGATIVE
Specific Gravity, Urine: 1.006 (ref 1.001–1.035)
YEAST: NONE SEEN [HPF]

## 2015-06-30 LAB — URINE CULTURE
Colony Count: NO GROWTH
ORGANISM ID, BACTERIA: NO GROWTH

## 2015-08-28 ENCOUNTER — Ambulatory Visit (INDEPENDENT_AMBULATORY_CARE_PROVIDER_SITE_OTHER): Payer: BLUE CROSS/BLUE SHIELD | Admitting: *Deleted

## 2015-08-28 DIAGNOSIS — Z23 Encounter for immunization: Secondary | ICD-10-CM

## 2015-09-04 ENCOUNTER — Ambulatory Visit (INDEPENDENT_AMBULATORY_CARE_PROVIDER_SITE_OTHER): Payer: BLUE CROSS/BLUE SHIELD | Admitting: Podiatry

## 2015-09-04 ENCOUNTER — Encounter: Payer: Self-pay | Admitting: Podiatry

## 2015-09-04 DIAGNOSIS — L603 Nail dystrophy: Secondary | ICD-10-CM

## 2015-09-04 NOTE — Addendum Note (Signed)
Addended by: Lottie RaterPREVETTE, ASHLEY E on: 09/04/2015 02:10 PM   Modules accepted: Orders

## 2015-09-04 NOTE — Progress Notes (Signed)
   Subjective:    Patient ID: Nicole Richardson, female    DOB: 12/28/1990, 25 y.o.   MRN: [id number]  HPI: She presents today and states that she has some thickening of her toenails bilateral foot. She states that generally she keeps them painted. She states that she's noticed that become more thick and unsightly and seems to be spreading.  Review of Systems  All other systems reviewed and are negative.      Objective:   Physical Exam: Vital signs are stable alert and oriented 3 pulses are palpable. Neurologic sensorium is intact. Deep tendon reflexes are intact and muscle strength is normal. Cutaneous evaluations are supple hydrated cutis no lesions or wounds. Her toenails fifth left third right due significantly demonstrated thickening with discoloration and subungual debris.        Assessment & Plan:  Nail dystrophy bilateral.  Plan: Samples of the nails were taken today for pathologic evaluation. I will follow up with her in 1 month for the report.

## 2015-10-04 ENCOUNTER — Encounter: Payer: Self-pay | Admitting: Podiatry

## 2015-10-04 ENCOUNTER — Ambulatory Visit (INDEPENDENT_AMBULATORY_CARE_PROVIDER_SITE_OTHER): Payer: BLUE CROSS/BLUE SHIELD | Admitting: Podiatry

## 2015-10-04 DIAGNOSIS — L603 Nail dystrophy: Secondary | ICD-10-CM

## 2015-10-04 DIAGNOSIS — Z79899 Other long term (current) drug therapy: Secondary | ICD-10-CM | POA: Diagnosis not present

## 2015-10-04 MED ORDER — TERBINAFINE HCL 250 MG PO TABS: 250.0000 mg | ORAL_TABLET | Freq: Every day | ORAL | 0 refills | Status: DC

## 2015-10-04 NOTE — Patient Instructions (Signed)

## 2015-10-04 NOTE — Progress Notes (Signed)
She presents today for follow-up of her nail culture. She states the nail is unchanged.  Objective: Vital signs are stable she is alert and oriented 3. Pulses are palpable. No check nail change on physical exam. Pathology report does demonstrate onychomycosis.  Assessment: Onychomycosis.  Plan: We discussed oral therapy today. She will start Lamisil 250 mg tablets #30 one by mouth daily with food. I also requested a liver profile and provided her with a requisition for that test. I will follow-up with her with concerns and results. Should she have questions or concerns she will notify S immediately. Otherwise I will follow up with her in 1 month for another liver profile and another 90 days worth of medication. We did discuss thoroughly today the possible side effects and complications of the utilization of this type of medicine. She was also provided a handout regarding this.

## 2015-10-05 LAB — HEPATIC FUNCTION PANEL
ALT: 7 IU/L (ref 0–32)
AST: 15 IU/L (ref 0–40)
Albumin: 4.4 g/dL (ref 3.5–5.5)
Alkaline Phosphatase: 44 IU/L (ref 39–117)
BILIRUBIN, DIRECT: 0.07 mg/dL (ref 0.00–0.40)
Bilirubin Total: 0.2 mg/dL (ref 0.0–1.2)
TOTAL PROTEIN: 7.2 g/dL (ref 6.0–8.5)

## 2015-11-08 ENCOUNTER — Ambulatory Visit (INDEPENDENT_AMBULATORY_CARE_PROVIDER_SITE_OTHER): Payer: BLUE CROSS/BLUE SHIELD | Admitting: Podiatry

## 2015-11-08 ENCOUNTER — Encounter: Payer: Self-pay | Admitting: Podiatry

## 2015-11-08 DIAGNOSIS — Z79899 Other long term (current) drug therapy: Secondary | ICD-10-CM

## 2015-11-08 DIAGNOSIS — L603 Nail dystrophy: Secondary | ICD-10-CM | POA: Diagnosis not present

## 2015-11-08 MED ORDER — TERBINAFINE HCL 250 MG PO TABS: 250.0000 mg | ORAL_TABLET | Freq: Every day | ORAL | 0 refills | Status: DC

## 2015-11-09 ENCOUNTER — Telehealth: Payer: Self-pay | Admitting: *Deleted

## 2015-11-09 LAB — HEPATIC FUNCTION PANEL
ALBUMIN: 4.5 g/dL (ref 3.5–5.5)
ALT: 13 IU/L (ref 0–32)
AST: 19 IU/L (ref 0–40)
Alkaline Phosphatase: 44 IU/L (ref 39–117)
Bilirubin Total: <0.2 mg/dL (ref 0.0–1.2)
Bilirubin, Direct: 0.08 mg/dL (ref 0.00–0.40)
Total Protein: 7.1 g/dL (ref 6.0–8.5)

## 2015-11-09 NOTE — Telephone Encounter (Addendum)
-----   Message from Elinor ParkinsonMax T Hyatt, [place] sent at 11/09/2015  7:49 AM EDT ----- Blood work looks great and may continue medication. Left message with Dr. Geryl RankinsHyatt's orders.

## 2015-11-09 NOTE — Progress Notes (Signed)
She presents today after having taken her first 30 days and Lamisil. She denies fever chills nausea and vomiting muscle aches headaches and rashes.  Objective: Vital signs are stable she is alert and oriented 3. Pulses are palpable. Neurologic sensorium is intact. Muscle strength is normal. No change in nail plates as of yet.  Assessment: Onychomycosis  Plan: We requested or blood work consisting of a liver profile and CBC. I also sent another prescription to her pharmacy for Lamisil 250 mg tablets #90 one by mouth daily and I will follow-up with her in 4 months. Should her blood work back abnormal I'll notify her that time. She will notify us with any worsens or concerns.

## 2015-12-25 ENCOUNTER — Ambulatory Visit (INDEPENDENT_AMBULATORY_CARE_PROVIDER_SITE_OTHER): Payer: BLUE CROSS/BLUE SHIELD | Admitting: Gynecology

## 2015-12-25 DIAGNOSIS — Z23 Encounter for immunization: Secondary | ICD-10-CM

## 2016-02-27 ENCOUNTER — Telehealth: Payer: Self-pay | Admitting: *Deleted

## 2016-02-27 NOTE — Telephone Encounter (Signed)
Prior Authorization for loryna birth control pills, medication has been approved.  [id number]. LORYNA TAB 3-0.02MG  is approved through 02/26/2017. For further questions, call [id number].

## 2016-03-11 ENCOUNTER — Ambulatory Visit (INDEPENDENT_AMBULATORY_CARE_PROVIDER_SITE_OTHER): Payer: 59 | Admitting: Podiatry

## 2016-03-11 DIAGNOSIS — Z79899 Other long term (current) drug therapy: Secondary | ICD-10-CM

## 2016-03-11 NOTE — Progress Notes (Signed)
She presents today for follow-up of her Lamisil therapy to the third digit of the right foot. She states that it is 100% improved.  Objective: Vital signs are stable she is alert and oriented 3. Pulses are palpable. 100% resolution of onychomycosis of the third digital nail plate right foot.  Assessment: Well-healing onychomycosis.  Plan: Follow up with me on an as-needed basis should any of this structure regress our returns she is to notify us immediately.

## 2016-06-28 ENCOUNTER — Encounter: Payer: Self-pay | Admitting: Gynecology

## 2016-06-28 ENCOUNTER — Ambulatory Visit (INDEPENDENT_AMBULATORY_CARE_PROVIDER_SITE_OTHER): Payer: 59 | Admitting: Gynecology

## 2016-06-28 ENCOUNTER — Other Ambulatory Visit: Payer: Self-pay | Admitting: Gynecology

## 2016-06-28 VITALS — BP 116/72 | Ht 64.0 in | Wt 121.0 lb

## 2016-06-28 DIAGNOSIS — Z01419 Encounter for gynecological examination (general) (routine) without abnormal findings: Secondary | ICD-10-CM

## 2016-06-28 LAB — CBC WITH DIFFERENTIAL/PLATELET
BASOS PCT: 0 %
Basophils Absolute: 0 cells/uL (ref 0–200)
EOS PCT: 1 %
Eosinophils Absolute: 67 cells/uL (ref 15–500)
HCT: 34.8 % — ABNORMAL LOW (ref 35.0–45.0)
Hemoglobin: 11.5 g/dL — ABNORMAL LOW (ref 11.7–15.5)
LYMPHS PCT: 48 %
Lymphs Abs: 3216 cells/uL (ref 850–3900)
MCH: 30.2 pg (ref 27.0–33.0)
MCHC: 33 g/dL (ref 32.0–36.0)
MCV: 91.3 fL (ref 80.0–100.0)
MONOS PCT: 6 %
MPV: 8.9 fL (ref 7.5–12.5)
Monocytes Absolute: 402 cells/uL (ref 200–950)
NEUTROS PCT: 45 %
Neutro Abs: 3015 cells/uL (ref 1500–7800)
PLATELETS: 286 10*3/uL (ref 140–400)
RBC: 3.81 MIL/uL (ref 3.80–5.10)
RDW: 13 % (ref 11.0–15.0)
WBC: 6.7 10*3/uL (ref 3.8–10.8)

## 2016-06-28 MED ORDER — DROSPIRENONE-ETHINYL ESTRADIOL 3-0.02 MG PO TABS: ORAL_TABLET | ORAL | 12 refills | Status: DC

## 2016-06-28 NOTE — Addendum Note (Signed)
Addended by: Dayna BarkerGARDNER, KIMBERLY K on: 06/28/2016 04:01 PM   Modules accepted: Orders

## 2016-06-28 NOTE — Patient Instructions (Signed)
Congratulations on your marriage !  Follow up in one year, sooner if any issues.

## 2016-06-28 NOTE — Progress Notes (Signed)
    Nicole Richardson [birth year] [id number]        26 y.o.  G0P0 for annual exam.    Past medical history,surgical history, problem list, medications, allergies, family history and social history were all reviewed and documented as reviewed in the EPIC chart.  ROS:  Performed with pertinent positives and negatives included in the history, assessment and plan.   Additional significant findings :  None   Exam: Kennon PortelaKim Gardner assistant Vitals:   06/28/16 1429  BP: 116/72  Weight: 121 lb (54.9 kg)  Height: 5\' 4"  (1.626 m)   Body mass index is 20.77 kg/m.  General appearance:  Normal affect, orientation and appearance. Skin: Grossly normal HEENT: Without gross lesions.  No cervical or supraclavicular adenopathy. Thyroid normal.  Lungs:  Clear without wheezing, rales or rhonchi Cardiac: RR, without RMG Abdominal:  Soft, nontender, without masses, guarding, rebound, organomegaly or hernia Breasts:  Examined lying and sitting without masses, retractions, discharge or axillary adenopathy. Pelvic:  Ext, BUS, Vagina: Normal  Cervix: Normal. Pap smear done  Uterus: Retroverted, normal size, shape and contour, midline and mobile nontender   Adnexa: Without masses or tenderness    Anus and perineum: Normal     Assessment/Plan:  26 y.o. G0P0 female for annual exam with regular menses, oral contraceptives.   1. Oral contraceptives. Continues on Yaz equivalent's doing well. Have discussed thrombosis risk with her. Refill 1 year provided. 2. Gardasil series received 3. STD screening declined, remains virginal. 4. Breast health. SBE monthly reviewed. 5. Pap smear 01/2014. Pap smear done today. No history of abnormal Pap smears previously. Plan repeat Pap smear at 3 year intervals per current screening guidelines. 6. Health maintenance. Baseline CBC and urinalysis ordered. No signs or symptoms to suggest need for further screening blood work. Follow up 1 year, sooner as  needed   Dara LordsFONTAINE,[name] MD, 2:53 PM 06/28/2016

## 2016-06-28 NOTE — Progress Notes (Signed)
Resent Rx 

## 2016-06-29 LAB — URINALYSIS W MICROSCOPIC + REFLEX CULTURE
Bacteria, UA: NONE SEEN [HPF]
Bilirubin Urine: NEGATIVE
CASTS: NONE SEEN [LPF]
Crystals: NONE SEEN [HPF]
Glucose, UA: NEGATIVE
Hgb urine dipstick: NEGATIVE
Ketones, ur: NEGATIVE
Leukocytes, UA: NEGATIVE
Nitrite: NEGATIVE
Protein, ur: NEGATIVE
RBC / HPF: NONE SEEN RBC/HPF (ref <=2)
SPECIFIC GRAVITY, URINE: 1.022 (ref 1.001–1.035)
WBC, UA: NONE SEEN WBC/HPF (ref <=5)
YEAST: NONE SEEN [HPF]
pH: 6.5 (ref 5.0–8.0)

## 2016-07-01 LAB — PAP IG W/ RFLX HPV ASCU

## 2016-07-02 ENCOUNTER — Other Ambulatory Visit: Payer: Self-pay | Admitting: *Deleted

## 2016-07-02 MED ORDER — FLUCONAZOLE 150 MG PO TABS: 150.0000 mg | ORAL_TABLET | Freq: Once | ORAL | 0 refills | Status: AC

## 2017-02-05 DIAGNOSIS — S99921A Unspecified injury of right foot, initial encounter: Secondary | ICD-10-CM | POA: Diagnosis not present

## 2017-02-19 ENCOUNTER — Telehealth: Payer: Self-pay | Admitting: *Deleted

## 2017-02-19 NOTE — Telephone Encounter (Signed)
Patient called and left message stating she is 6 days late on cycle, takes [place] birth control pills, daily on time, states on Jan 11th or 12th states the condom fell off during intercourse, LMP:01/16/18, 2 negative UPT ,has appointment scheduled on 02/24/17 for OV, pt said she is nervous why her cycle has not yet started.

## 2017-02-20 DIAGNOSIS — Z3041 Encounter for surveillance of contraceptive pills: Secondary | ICD-10-CM | POA: Diagnosis not present

## 2017-02-20 DIAGNOSIS — Z3200 Encounter for pregnancy test, result unknown: Secondary | ICD-10-CM | POA: Diagnosis not present

## 2017-02-20 DIAGNOSIS — R110 Nausea: Secondary | ICD-10-CM | POA: Diagnosis not present

## 2017-02-20 DIAGNOSIS — G44211 Episodic tension-type headache, intractable: Secondary | ICD-10-CM | POA: Diagnosis not present

## 2017-02-24 ENCOUNTER — Ambulatory Visit: Payer: 59 | Admitting: Women's Health

## 2017-02-24 ENCOUNTER — Encounter: Payer: Self-pay | Admitting: Women's Health

## 2017-02-24 VITALS — BP 124/80

## 2017-02-24 DIAGNOSIS — N912 Amenorrhea, unspecified: Secondary | ICD-10-CM | POA: Diagnosis not present

## 2017-02-24 LAB — PREGNANCY, URINE: Preg Test, Ur: NEGATIVE

## 2017-02-24 NOTE — Progress Notes (Signed)
26yo MWF G0P0 presents with complaint of nausea, headache, fatigue. Amennorheic x 10 days. Approximately 5 missed BCPs in last month. Condom came off twice on [facility]. 3 UPTs at home, all were negative. Complains of "sandpaper" feeling on right breast. 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 No known health problems.  Exam: UPT negative, serum HCG performed, awaiting results. Right breast normal, dry skin at 9 o'clock near right nipple.  Breast exam and sitting and lying position without visible lesions, dimpling, palpable nodules.  Amennorhea Dry skin, right breast  Plan: Will follow up with patient regarding results of serum HCG. If result is negative, patient to start new pack of generic Yaz with next cycle, condoms until and first month back on.. Counseled to call office is normal menstrual cycle does not resume. Advised to use lotion on right breast. Reassurance given.

## 2017-02-25 ENCOUNTER — Encounter: Payer: Self-pay | Admitting: Women's Health

## 2017-02-25 LAB — HCG, SERUM, QUALITATIVE: Preg, Serum: NEGATIVE

## 2017-03-17 ENCOUNTER — Other Ambulatory Visit: Payer: Self-pay | Admitting: Women's Health

## 2017-03-17 MED ORDER — MEDROXYPROGESTERONE ACETATE 10 MG PO TABS: 10.0000 mg | ORAL_TABLET | Freq: Every day | ORAL | 0 refills | Status: DC

## 2017-06-13 DIAGNOSIS — Z021 Encounter for pre-employment examination: Secondary | ICD-10-CM | POA: Diagnosis not present

## 2017-07-07 ENCOUNTER — Telehealth: Payer: Self-pay | Admitting: *Deleted

## 2017-07-07 MED ORDER — DROSPIRENONE-ETHINYL ESTRADIOL 3-0.02 MG PO TABS: ORAL_TABLET | ORAL | 1 refills | Status: DC

## 2017-07-07 NOTE — Telephone Encounter (Signed)
Patient has annual exam scheduled on 08/25/17, needs refill on birth control pills, Rx sent.

## 2017-07-23 DIAGNOSIS — Z111 Encounter for screening for respiratory tuberculosis: Secondary | ICD-10-CM | POA: Diagnosis not present

## 2017-07-25 DIAGNOSIS — Z111 Encounter for screening for respiratory tuberculosis: Secondary | ICD-10-CM | POA: Diagnosis not present

## 2017-07-27 ENCOUNTER — Other Ambulatory Visit: Payer: Self-pay | Admitting: Gynecology

## 2017-08-13 DIAGNOSIS — N3001 Acute cystitis with hematuria: Secondary | ICD-10-CM | POA: Diagnosis not present

## 2017-08-17 ENCOUNTER — Other Ambulatory Visit: Payer: Self-pay | Admitting: Gynecology

## 2017-08-22 ENCOUNTER — Encounter: Payer: 59 | Admitting: Gynecology

## 2017-09-03 ENCOUNTER — Encounter: Payer: Self-pay | Admitting: Gynecology

## 2017-09-03 ENCOUNTER — Ambulatory Visit (INDEPENDENT_AMBULATORY_CARE_PROVIDER_SITE_OTHER): Payer: 59 | Admitting: Gynecology

## 2017-09-03 VITALS — BP 112/66 | Ht 64.0 in | Wt 121.0 lb

## 2017-09-03 DIAGNOSIS — Z01419 Encounter for gynecological examination (general) (routine) without abnormal findings: Secondary | ICD-10-CM | POA: Diagnosis not present

## 2017-09-03 DIAGNOSIS — D508 Other iron deficiency anemias: Secondary | ICD-10-CM

## 2017-09-03 LAB — CBC WITH DIFFERENTIAL/PLATELET
BASOS ABS: 20 {cells}/uL (ref 0–200)
Basophils Relative: 0.3 %
EOS PCT: 2.2 %
Eosinophils Absolute: 147 cells/uL (ref 15–500)
HEMATOCRIT: 37.2 % (ref 35.0–45.0)
HEMOGLOBIN: 12.6 g/dL (ref 11.7–15.5)
LYMPHS ABS: 2492 {cells}/uL (ref 850–3900)
MCH: 30.4 pg (ref 27.0–33.0)
MCHC: 33.9 g/dL (ref 32.0–36.0)
MCV: 89.6 fL (ref 80.0–100.0)
MPV: 9.6 fL (ref 7.5–12.5)
Monocytes Relative: 6.4 %
NEUTROS ABS: 3611 {cells}/uL (ref 1500–7800)
NEUTROS PCT: 53.9 %
Platelets: 334 10*3/uL (ref 140–400)
RBC: 4.15 10*6/uL (ref 3.80–5.10)
RDW: 11.7 % (ref 11.0–15.0)
Total Lymphocyte: 37.2 %
WBC: 6.7 10*3/uL (ref 3.8–10.8)
WBCMIX: 429 {cells}/uL (ref 200–950)

## 2017-09-03 MED ORDER — NORETHINDRONE ACET-ETHINYL EST 1-20 MG-MCG PO TABS
1.0000 | ORAL_TABLET | Freq: Every day | ORAL | 4 refills | Status: DC
Start: 2017-09-03 — End: 2018-04-27

## 2017-09-03 NOTE — Progress Notes (Signed)
    Nicole Richardson 10/19/1990 [id number]        27 y.o.  G0P0000 for annual gynecologic exam.  Without gynecologic complaints.  Did want to talk about switching to a different birth control pill because her Yaz equivalents are becoming very expensive.  Past medical history,surgical history, problem list, medications, allergies, family history and social history were all reviewed and documented as reviewed in the EPIC chart.  ROS:  Performed with pertinent positives and negatives included in the history, assessment and plan.   Additional significant findings : None   Exam: Kennon PortelaKim Gardner assistant Vitals:   09/03/17 1220  BP: 112/66  Weight: 121 lb (54.9 kg)  Height: 5\' 4"  (1.626 m)   Body mass index is 20.77 kg/m.  General appearance:  Normal affect, orientation and appearance. Skin: Grossly normal HEENT: Without gross lesions.  No cervical or supraclavicular adenopathy. Thyroid normal.  Lungs:  Clear without wheezing, rales or rhonchi Cardiac: RR, without RMG Abdominal:  Soft, nontender, without masses, guarding, rebound, organomegaly or hernia Breasts:  Examined lying and sitting without masses, retractions, discharge or axillary adenopathy. Pelvic:  Ext, BUS, Vagina: Normal  Cervix: Normal  Uterus: Retroverted, normal size, shape and contour, midline and mobile nontender   Adnexa: Without masses or tenderness    Anus and perineum: Normal   Rectovaginal: Normal sphincter tone without palpated masses or tenderness.    Assessment/Plan:  27 y.o. G0P0000 female for annual gynecologic exam with regular menses, oral contraceptives.   1. Oral contraceptives.  Patient on Yaz equivalent but this is becoming expensive.  We will try switching to Loestrin 1/20 equivalent.  1 year refill provided. 2. STD screening offered and declined. 3. Gardasil series reportedly received. 4. Breast health.  Breast exam normal. 5. Pap smear 06/2016.  No Pap smear done today.  No history of abnormal  Pap smears.  Plan repeat Pap smear at 3-year interval per current screening guidelines. 6. Health maintenance.  Hemoglobin 11 last year.  Recheck CBC today.  Follow-up in 1 year, sooner as needed.  Dara Lordsimothy P Daci Stubbe MD, 12:43 PM 09/03/2017

## 2017-09-03 NOTE — Patient Instructions (Addendum)
Follow-up in 1 year for annual exam, sooner if any issues. 

## 2017-09-11 ENCOUNTER — Other Ambulatory Visit: Payer: Self-pay | Admitting: Gynecology

## 2017-09-25 DIAGNOSIS — S8255XA Nondisplaced fracture of medial malleolus of left tibia, initial encounter for closed fracture: Secondary | ICD-10-CM | POA: Diagnosis not present

## 2017-09-25 DIAGNOSIS — S93492A Sprain of other ligament of left ankle, initial encounter: Secondary | ICD-10-CM | POA: Diagnosis not present

## 2017-10-03 DIAGNOSIS — M25572 Pain in left ankle and joints of left foot: Secondary | ICD-10-CM | POA: Diagnosis not present

## 2017-10-08 DIAGNOSIS — N390 Urinary tract infection, site not specified: Secondary | ICD-10-CM | POA: Diagnosis not present

## 2017-10-10 ENCOUNTER — Emergency Department
Admission: EM | Admit: 2017-10-10 | Discharge: 2017-10-10 | Disposition: A | Discharge status: Home / Self Care | Payer: 59 | Attending: Emergency Medicine | Admitting: Emergency Medicine

## 2017-10-10 ENCOUNTER — Other Ambulatory Visit: Payer: Self-pay

## 2017-10-10 DIAGNOSIS — R110 Nausea: Secondary | ICD-10-CM | POA: Diagnosis not present

## 2017-10-10 DIAGNOSIS — Y69 Unspecified misadventure during surgical and medical care: Secondary | ICD-10-CM | POA: Diagnosis not present

## 2017-10-10 DIAGNOSIS — T3695XA Adverse effect of unspecified systemic antibiotic, initial encounter: Secondary | ICD-10-CM | POA: Diagnosis not present

## 2017-10-10 DIAGNOSIS — T50905A Adverse effect of unspecified drugs, medicaments and biological substances, initial encounter: Secondary | ICD-10-CM | POA: Insufficient documentation

## 2017-10-10 DIAGNOSIS — R197 Diarrhea, unspecified: Secondary | ICD-10-CM

## 2017-10-10 DIAGNOSIS — Z79899 Other long term (current) drug therapy: Secondary | ICD-10-CM | POA: Insufficient documentation

## 2017-10-10 DIAGNOSIS — T887XXA Unspecified adverse effect of drug or medicament, initial encounter: Secondary | ICD-10-CM | POA: Diagnosis not present

## 2017-10-10 LAB — URINALYSIS, COMPLETE (UACMP) WITH MICROSCOPIC
BILIRUBIN URINE: NEGATIVE
GLUCOSE, UA: NEGATIVE mg/dL
HGB URINE DIPSTICK: NEGATIVE
Ketones, ur: NEGATIVE mg/dL
NITRITE: NEGATIVE
Protein, ur: NEGATIVE mg/dL
Specific Gravity, Urine: 1.013 (ref 1.005–1.030)
pH: 7 (ref 5.0–8.0)

## 2017-10-10 LAB — POCT PREGNANCY, URINE: Preg Test, Ur: NEGATIVE

## 2017-10-10 MED ORDER — ONDANSETRON 4 MG PO TBDP
4.0000 mg | ORAL_TABLET | Freq: Once | ORAL | Status: AC
  Administered 2017-10-10: 4 mg via ORAL
  Filled 2017-10-10: qty 1

## 2017-10-10 MED ORDER — DIPHENHYDRAMINE HCL 12.5 MG/5ML PO ELIX
25.0000 mg | ORAL_SOLUTION | Freq: Once | ORAL | Status: AC
  Administered 2017-10-10: 25 mg via ORAL
  Filled 2017-10-10: qty 10

## 2017-10-10 NOTE — Discharge Instructions (Addendum)
At this time, there is no evidence of significant allergic reaction.  If you have hives, you feel like your throat is closing, you feel lightheaded or significantly unwell, please return to the emergency room.  If it is significant call 911 and return to the emergency room.  Otherwise, I do expect that there is a chance he may have continued diarrhea and congestion.  But this is okay.  Take Benadryl as needed if it makes you feel better, only use as directed and do not drive while taking it.  Do not drive on the way home from here.  We have sent her urine for urine culture, if it comes back positive for something we need to treat, we will call you.  At this time there is no evidence that you have a significant Bactrim allergy, it is likely safe for you to use in the future but we would advise that you discuss with any provider in the future your variances today before it is prescribed.  It is in your best interest to have a primary care doctor.  I have recommended two above.  If you feel worse in any significant way return to the emergency room.

## 2017-10-10 NOTE — ED Triage Notes (Signed)
Pt started taking bactrim Wednesday for a UTI and now is feeling like she is congested. Co nausea and shaky, thinks may be reacting to medication.

## 2017-10-10 NOTE — ED Notes (Signed)
Signature  Pad not working  Pt verbalizes knowledge of plan of care

## 2017-10-10 NOTE — ED Provider Notes (Addendum)
[facility] Emergency Department Provider Note  ____________________________________________   I have reviewed the triage vital signs and the nursing notes. Where available I have reviewed prior notes and, if possible and indicated, outside hospital notes.    HISTORY  Chief Complaint Allergic Reaction    HPI Nicole Richardson is a 27 y.o. female  Who presents today complaining of feeling like she is having a reaction to the Bactrim that she is on for UTI for the last 2 days.  She states her UTI symptoms are better she got the Bactrim at a fast [facility], she states that they saw blood in her urine and thought she had a UTI.  She states her burning is gone and she feels better in that respect however after being on the Bactrim for 2 days she woke up this morning felt "congested" and had some diarrhea.  She states she became very anxious about the symptoms and came in.  She has no difficulty swallowing no hives, she does have some nausea.  She denies any abdominal pain or fever.  She denies chest pain or shortness of breath.  She states this has caused her a great deal of anxiety.  She denies any tongue lip or throat swelling, she denies any wheeze or difficulty breathing, she denies any hives, no other allergic reaction or other symptoms.  Her diarrhea was x1 and not bloody or bilious.  She is had symptoms for about 4 or 5 hours.  Has not taken anything for it nothing makes it better nothing makes it worse     Past Medical History:  Diagnosis Date  . Acid reflux     Patient Active Problem List   Diagnosis Date Noted  . Dysfunctional gallbladder 06/24/2012  . Acid reflux   . Periods, menstrual, difficult 08/08/2011    Past Surgical History:  Procedure Laterality Date  . ADENOIDECTOMY    . Broken arm    . CHOLECYSTECTOMY    . WISDOM TOOTH EXTRACTION      Prior to Admission medications   Medication Sig Start Date End Date Taking? Authorizing Provider  NIKKI  3-0.02 MG tablet TAKE 1 TABLET BY MOUTH EVERY DAY 08/18/17   Fontaine, Nadyne Coombesimothy P, MD  norethindrone-ethinyl estradiol (MICROGESTIN,JUNEL,LOESTRIN) 1-20 MG-MCG tablet Take 1 tablet by mouth daily. 09/03/17   Fontaine, Nadyne Coombesimothy P, MD    Allergies Parke Simmersrab [shellfish allergy]  Family History  Problem Relation Age of Onset  . Cancer Paternal Grandfather        melanoma  . Lung cancer Paternal Grandfather   . Alcohol abuse Maternal Grandmother   . Cancer Maternal Grandfather        Colon    Social History Social History   Tobacco Use  . Smoking status: Never Smoker  . Smokeless tobacco: Never Used  Substance Use Topics  . Alcohol use: Yes    Alcohol/week: 0.0 standard drinks    Comment: Rare  . Drug use: No    Review of Systems Constitutional: No fever/chills Eyes: No visual changes. ENT: No sore throat. No stiff neck no neck pain Cardiovascular: Denies chest pain. Respiratory: Denies shortness of breath. Gastrointestinal:   no vomiting.  + diarrhea.  No constipation. Genitourinary: Negative for dysuria. Musculoskeletal: Negative lower extremity swelling Skin: Negative for rash. Neurological: Negative for severe headaches, focal weakness or numbness.   ____________________________________________   PHYSICAL EXAM:  VITAL SIGNS: ED Triage Vitals [10/10/17 0657]  Enc Vitals Group     BP 121/72  Pulse Rate (!) 103     Resp 20     Temp 98.6 F (37 C)     Temp Source Oral     SpO2 100 %     Weight 122 lb (55.3 kg)     Height 5\' 4"  (1.626 m)     Head Circumference      Peak Flow      Pain Score 0     Pain Loc      Pain Edu?      Excl. in GC?     Constitutional: Alert and oriented. Well appearing and in no acute distress.  Patient is quite anxious but in no acute distress Eyes: Conjunctivae are normal Head: Atraumatic HEENT: No congestion/rhinnorhea. Mucous membranes are moist.  Oropharynx non-erythematous, no angioedema noted normal voice no stridor no swelling  of the tongue behind the tongue around the tongue of the lips or other where Neck:   Nontender with no meningismus, no masses, no stridor Cardiovascular: Normal rate, regular rhythm. Grossly normal heart sounds.  Good peripheral circulation. Respiratory: Normal respiratory effort.  No retractions. Lungs CTAB. Abdominal: Soft and nontender. No distention. No guarding no rebound Back:  There is no focal tenderness or step off.  there is no midline tenderness there are no lesions noted. there is no CVA tenderness  Musculoskeletal: No lower extremity tenderness, no upper extremity tenderness. No joint effusions, no DVT signs strong distal pulses no edema Neurologic:  Normal speech and language. No gross focal neurologic deficits are appreciated.  Skin:  Skin is warm, dry and intact. No rash noted.  No hives in particular noted. Psychiatric: Mood and affect are normal. Speech and behavior are normal.  ____________________________________________   LABS (all labs ordered are listed, but only abnormal results are displayed)  Labs Reviewed  URINE CULTURE  URINALYSIS, COMPLETE (UACMP) WITH MICROSCOPIC  POC URINE PREG, ED    Pertinent labs  results that were available during my care of the patient were reviewed by me and considered in my medical decision making (see chart for details). ____________________________________________  EKG  I personally interpreted any EKGs ordered by me or triage  ____________________________________________  RADIOLOGY  Pertinent labs & imaging results that were available during my care of the patient were reviewed by me and considered in my medical decision making (see chart for details). If possible, patient and/or family made aware of any abnormal findings.  No results found. ____________________________________________    PROCEDURES  Procedure(s) performed: None  Procedures  Critical Care performed:  None  ____________________________________________   INITIAL IMPRESSION / ASSESSMENT AND PLAN / ED COURSE  Pertinent labs & imaging results that were available during my care of the patient were reviewed by me and considered in my medical decision making (see chart for details).  Patient here feeling anxious and unwell with diarrhea and congestion on Bactrim for UTI.  She would like to come off the Bactrim but she would like something else for the UTI.  I have tried to find results from her urinary culture from outpatient and that it is not available on care everywhere.  I have asked her to give me another urine sample and we can certainly send a culture off that, I will change her to Keflex for her urinary symptoms which are improving, and we will give her some Benadryl for her perceived but not clinically evident allergic reaction.  She does have some diarrhea which is likely not an allergic reaction and can be a side  effect of any medication especially antibiotics.  She is only had diarrhea x1.  Her belly is benign.  Lungs are clear, is my hope that as she relaxes more she will feel better, but we will watch her very closely to ensure that there is no degradation of symptoms into true anaphylaxis.  ----------------------------------------- 9:29 AM on 10/10/2017 -----------------------------------------  Patient feels much more calm and better, at this time, there is no evidence still of any significant allergic reaction.  I have extensively discussed her care with her.  I do feel she needs institute primary care and I have referred her to [facility], her care has been somewhat fractionated thus far.  I have advised her that this time there is no evidence of a significant urinary tract infection and while she may have a partially treated UTI she is asymptomatic and her urine looks good.  My concern would be to start her on a new antibiotic and have her have some reaction or continue to  reaction to what is already going on, and then make it so she can take another class of antibiotics.  She does not have any evidence of a clear allergic reaction to Bactrim.  She did have diarrhea and felt congested.  She feels better now.  Certainly could be allergic and she should use Bactrim with caution in the future but there is certainly no evidence of anaphylaxis.  In addition, I do not think steroids are indicated.  We will start the patient on as needed Benadryl at home.  I do not think she needs an EpiPen.  Patient is made aware that she has had diarrhea and likely will have more and this could be absolutely unrelated to any allergic reaction but simply be related to a viral syndrome and/or robotic associated diarrhea.  Patient is very comfortable with all that she has no complaints she is laughing and joking in the room and we will discharge.   ____________________________________________   FINAL CLINICAL IMPRESSION(S) / ED DIAGNOSES  Final diagnoses:  None      This chart was dictated using voice recognition software.  Despite best efforts to proofread,  errors can occur which can change meaning.      Jeanmarie Plant, MD 10/10/17 1610    Jeanmarie Plant, MD 10/10/17 [phone]

## 2017-10-10 NOTE — ED Notes (Addendum)
Pt reports  symptoms of tightness in chest throat Pt started on bactrim 2 days ago for uti . No rash  No itching  Anxious . Pt also reports  diarrhea No angioedema Patient is speaking in complete sentances

## 2017-10-11 DIAGNOSIS — M25572 Pain in left ankle and joints of left foot: Secondary | ICD-10-CM | POA: Diagnosis not present

## 2017-10-11 LAB — URINE CULTURE: CULTURE: NO GROWTH

## 2017-10-17 DIAGNOSIS — M76822 Posterior tibial tendinitis, left leg: Secondary | ICD-10-CM | POA: Diagnosis not present

## 2017-10-23 DIAGNOSIS — R1011 Right upper quadrant pain: Secondary | ICD-10-CM | POA: Diagnosis not present

## 2017-10-23 DIAGNOSIS — R1013 Epigastric pain: Secondary | ICD-10-CM | POA: Diagnosis not present

## 2017-10-23 DIAGNOSIS — R197 Diarrhea, unspecified: Secondary | ICD-10-CM | POA: Diagnosis not present

## 2017-11-10 ENCOUNTER — Ambulatory Visit: Payer: 59 | Admitting: Adult Health

## 2017-11-10 ENCOUNTER — Encounter: Payer: Self-pay | Admitting: Adult Health

## 2017-11-10 VITALS — BP 114/60 | HR 88 | Resp 16 | Ht 64.0 in | Wt 124.0 lb

## 2017-11-10 DIAGNOSIS — R0982 Postnasal drip: Secondary | ICD-10-CM | POA: Diagnosis not present

## 2017-11-10 DIAGNOSIS — Z23 Encounter for immunization: Secondary | ICD-10-CM

## 2017-11-10 DIAGNOSIS — K2970 Gastritis, unspecified, without bleeding: Secondary | ICD-10-CM

## 2017-11-10 MED ORDER — FLUTICASONE PROPIONATE 50 MCG/ACT NA SUSP: 2.0000 | Freq: Every day | NASAL | 6 refills | Status: DC

## 2017-11-10 NOTE — Patient Instructions (Signed)

## 2017-11-10 NOTE — Progress Notes (Signed)
[facility] [address] [place], [place] [address]  Internal MEDICINE  Office Visit Note  Patient Name: Nicole Richardson  [id number]  [id number]  Date of Service: 11/10/2017   Complaints/HPI Pt is here for establishment of PCP. Chief Complaint  Patient presents with  . Gastroesophageal Reflux    new patient    HPI Pt here to establish care.  She is a well appearing 27yo caucasian female.  She has a medical history remarkable for gastritis.  She is currently on a temporary course of Prilosec for this.  She reports a surgical history of Cholecystectomy, and two ortho surgeries on her right forearm for radial and ulnar fracture. She does not use tobacco or street drugs, and she reports drinking socially at times.  She is currently on Birth control, and sees a OBGYN regularly.  Today she does mention sinus congestion, cough, sore throat and runny nose for 2 days.  She denies fever or chills at this time.    Current Medication: Outpatient Encounter Medications as of 11/10/2017  Medication Sig  . norethindrone-ethinyl estradiol (MICROGESTIN,JUNEL,LOESTRIN) 1-20 MG-MCG tablet Take 1 tablet by mouth daily.  Marland Kitchen omeprazole (PRILOSEC) 40 MG capsule Take 40 mg by mouth daily.  . fluticasone (FLONASE) 50 MCG/ACT nasal spray Place 2 sprays into both nostrils daily.  . [DISCONTINUED] NIKKI 3-0.02 MG tablet TAKE 1 TABLET BY MOUTH EVERY DAY (Patient not taking: Reported on 10/10/2017)   No facility-administered encounter medications on file as of 11/10/2017.     Surgical History: Past Surgical History:  Procedure Laterality Date  . ADENOIDECTOMY    . Broken arm    . CHOLECYSTECTOMY    . WISDOM TOOTH EXTRACTION      Medical History: Past Medical History:  Diagnosis Date  . Acid reflux     Family History: Family History  Problem Relation Age of Onset  . Cancer Paternal Grandfather        melanoma  . Lung cancer Paternal Grandfather   . Alcohol abuse Maternal Grandmother    . Cancer Maternal Grandfather        Colon    Social History   Socioeconomic History  . Marital status: Married    Spouse name: Not on file  . Number of children: Not on file  . Years of education: Not on file  . Highest education level: Not on file  Occupational History  . Not on file  Social Needs  . Financial resource strain: Not on file  . Food insecurity:    Worry: Not on file    Inability: Not on file  . Transportation needs:    Medical: Not on file    Non-medical: Not on file  Tobacco Use  . Smoking status: Never Smoker  . Smokeless tobacco: Never Used  Substance and Sexual Activity  . Alcohol use: Yes    Alcohol/week: 0.0 standard drinks    Comment: Rare  . Drug use: No  . Sexual activity: Yes    Birth control/protection: Pill    Comment: 1st intercourse 27 yo-1 partner  Lifestyle  . Physical activity:    Days per week: Not on file    Minutes per session: Not on file  . Stress: Not on file  Relationships  . Social connections:    Talks on phone: Not on file    Gets together: Not on file    Attends religious service: Not on file    Active member of club or organization: Not on file  Attends meetings of clubs or organizations: Not on file    Relationship status: Not on file  . Intimate partner violence:    Fear of current or ex partner: Not on file    Emotionally abused: Not on file    Physically abused: Not on file    Forced sexual activity: Not on file  Other Topics Concern  . Not on file  Social History Narrative  . Not on file     Review of Systems  Constitutional: Negative for chills, fatigue and unexpected weight change.  HENT: Positive for postnasal drip, rhinorrhea, sinus pressure and sore throat. Negative for congestion and sneezing.   Eyes: Negative for photophobia, pain and redness.  Respiratory: Negative for cough, chest tightness and shortness of breath.   Cardiovascular: Negative for chest pain and palpitations.   Gastrointestinal: Negative for abdominal pain, constipation, diarrhea, nausea and vomiting.  Endocrine: Negative.   Genitourinary: Negative for dysuria and frequency.  Musculoskeletal: Negative for arthralgias, back pain, joint swelling and neck pain.  Skin: Negative for rash.  Allergic/Immunologic: Negative.   Neurological: Negative for tremors and numbness.  Hematological: Negative for adenopathy. Does not bruise/bleed easily.  Psychiatric/Behavioral: Negative for behavioral problems and sleep disturbance. The patient is not nervous/anxious.     Vital Signs: BP 114/60   Pulse 88   Resp 16   Ht 5\' 4"  (1.626 m)   Wt 124 lb (56.2 kg)   SpO2 99%   BMI 21.28 kg/m    Physical Exam  Constitutional: She is oriented to person, place, and time. She appears well-developed and well-nourished. No distress.  HENT:  Head: Normocephalic and atraumatic.  Right Ear: Hearing, tympanic membrane, external ear and ear canal normal.  Left Ear: Hearing, tympanic membrane, external ear and ear canal normal.  Nose: Right sinus exhibits maxillary sinus tenderness and frontal sinus tenderness. Left sinus exhibits maxillary sinus tenderness and frontal sinus tenderness.  Mouth/Throat: Oropharynx is clear and moist and mucous membranes are normal. No oropharyngeal exudate or posterior oropharyngeal edema. Tonsils are 0 on the right. Tonsils are 0 on the left. No tonsillar exudate.  Eyes: Pupils are equal, round, and reactive to light. EOM are normal.  Neck: Normal range of motion. Neck supple. No JVD present. No tracheal deviation present. No thyromegaly present.  Cardiovascular: Normal rate, regular rhythm and normal heart sounds. Exam reveals no gallop and no friction rub.  No murmur heard. Pulmonary/Chest: Effort normal and breath sounds normal. No respiratory distress. She has no wheezes. She has no rales. She exhibits no tenderness.  Abdominal: Soft. There is no tenderness. There is no guarding.   Musculoskeletal: Normal range of motion.  Lymphadenopathy:    She has no cervical adenopathy.  Neurological: She is alert and oriented to person, place, and time. No cranial nerve deficit.  Skin: Skin is warm and dry. She is not diaphoretic.  Psychiatric: She has a normal mood and affect. Her behavior is normal. Judgment and thought content normal.  Nursing note and vitals reviewed.  Assessment/Plan: 1. PND (post-nasal drip) Encourage patient to continue to take DayQuil.  Given prescription for Flonase, instructed on use.  Also instructed patient that if she develops a fever or any other new symptoms to call office and would consider sending Augmentin. - fluticasone (FLONASE) 50 MCG/ACT nasal spray; Place 2 sprays into both nostrils daily.  Dispense: 16 g; Refill: 6  2. Gastritis without bleeding, unspecified chronicity, unspecified gastritis type Patient currently taking Prilosec for the last 2 to 3  weeks for gastritis.  Patient reports when she finishes this current production she is going to come off and tried with Tums or other OTC medications.  3. Flu vaccine need - Flu Vaccine MDCK QUAD PF  General Counseling: Mekaela verbalizes understanding of the findings of todays visit and agrees with plan of treatment. I have discussed any further diagnostic evaluation that may be needed or ordered today. We also reviewed her medications today. she has been encouraged to call the office with any questions or concerns that should arise related to todays visit.  Orders Placed This Encounter  Procedures  . Flu Vaccine MDCK QUAD PF    Meds ordered this encounter  Medications  . fluticasone (FLONASE) 50 MCG/ACT nasal spray    Sig: Place 2 sprays into both nostrils daily.    Dispense:  16 g    Refill:  6    Time spent: 25 Minutes   This patient was seen by Blima Ledger AGNP-C in Collaboration with Dr Lyndon Code as a part of collaborative care agreement  Johnna Acosta  AGNP-C Internal Medicine

## 2018-01-06 DIAGNOSIS — J0110 Acute frontal sinusitis, unspecified: Secondary | ICD-10-CM | POA: Diagnosis not present

## 2018-01-30 ENCOUNTER — Encounter: Payer: Self-pay | Admitting: Adult Health

## 2018-01-30 ENCOUNTER — Ambulatory Visit (INDEPENDENT_AMBULATORY_CARE_PROVIDER_SITE_OTHER): Payer: 59 | Admitting: Adult Health

## 2018-01-30 VITALS — BP 110/66 | HR 99 | Resp 16 | Ht 64.0 in | Wt 126.2 lb

## 2018-01-30 DIAGNOSIS — K219 Gastro-esophageal reflux disease without esophagitis: Secondary | ICD-10-CM | POA: Diagnosis not present

## 2018-01-30 DIAGNOSIS — Z0001 Encounter for general adult medical examination with abnormal findings: Secondary | ICD-10-CM

## 2018-01-30 DIAGNOSIS — R5383 Other fatigue: Secondary | ICD-10-CM | POA: Diagnosis not present

## 2018-01-30 DIAGNOSIS — R300 Dysuria: Secondary | ICD-10-CM

## 2018-01-30 DIAGNOSIS — R519 Headache, unspecified: Secondary | ICD-10-CM

## 2018-01-30 DIAGNOSIS — R51 Headache: Secondary | ICD-10-CM

## 2018-01-30 NOTE — Progress Notes (Signed)
[facility] [address] [place], [place] [id number]  Internal MEDICINE  Office Visit Note  Patient Name: Nicole Richardson  [id number] 26, 1992  [id number]  Date of Service: 01/31/2018  Chief Complaint  Patient presents with  . Annual Exam     HPI Pt is here for routine health maintenance examination.  She is a well-appearing 28 year old Caucasian female.  Generally is doing well and denies any complaint at this time.  She reports that she sees OB/GYN for breast and pelvic exams.  She has a history of GERD as well as headaches.  She is not currently take any medications for this.  She reports good control of her symptoms at this time.  She denies any alcohol, nicotine, or illicit drug use.  Current Medication: Outpatient Encounter Medications as of 01/30/2018  Medication Sig  . norethindrone-ethinyl estradiol (MICROGESTIN,JUNEL,LOESTRIN) 1-20 MG-MCG tablet Take 1 tablet by mouth daily.  . [DISCONTINUED] fluticasone (FLONASE) 50 MCG/ACT nasal spray Place 2 sprays into both nostrils daily.  . [DISCONTINUED] omeprazole (PRILOSEC) 40 MG capsule Take 40 mg by mouth daily.   No facility-administered encounter medications on file as of 01/30/2018.     Surgical History: Past Surgical History:  Procedure Laterality Date  . ADENOIDECTOMY    . Broken arm    . CHOLECYSTECTOMY    . WISDOM TOOTH EXTRACTION      Medical History: Past Medical History:  Diagnosis Date  . Acid reflux     Family History: Family History  Problem Relation Age of Onset  . Cancer Paternal Grandfather        melanoma  . Lung cancer Paternal Grandfather   . Alcohol abuse Maternal Grandmother   . Cancer Maternal Grandfather        Colon      Review of Systems  Constitutional: Negative for chills, fatigue and unexpected weight change.  HENT: Negative for congestion, rhinorrhea, sneezing and sore throat.   Eyes: Negative for photophobia, pain and redness.  Respiratory: Negative for cough, chest  tightness and shortness of breath.   Cardiovascular: Negative for chest pain and palpitations.  Gastrointestinal: Negative for abdominal pain, constipation, diarrhea, nausea and vomiting.  Endocrine: Negative.   Genitourinary: Negative for dysuria and frequency.  Musculoskeletal: Negative for arthralgias, back pain, joint swelling and neck pain.  Skin: Negative for rash.  Allergic/Immunologic: Negative.   Neurological: Negative for tremors and numbness.  Hematological: Negative for adenopathy. Does not bruise/bleed easily.  Psychiatric/Behavioral: Negative for behavioral problems and sleep disturbance. The patient is not nervous/anxious.      Vital Signs: BP 110/66   Pulse 99   Resp 16   Ht 5\' 4"  (1.626 m)   Wt 126 lb 3.2 oz (57.2 kg)   SpO2 100%   BMI 21.66 kg/m    Physical Exam Vitals signs and nursing note reviewed.  Constitutional:      General: She is not in acute distress.    Appearance: She is well-developed. She is not diaphoretic.  HENT:     Head: Normocephalic and atraumatic.     Mouth/Throat:     Pharynx: No oropharyngeal exudate.  Eyes:     Pupils: Pupils are equal, round, and reactive to light.  Neck:     Musculoskeletal: Normal range of motion and neck supple.     Thyroid: No thyromegaly.     Vascular: No JVD.     Trachea: No tracheal deviation.  Cardiovascular:     Rate and Rhythm: Normal rate and regular rhythm.  Heart sounds: Normal heart sounds. No murmur. No friction rub. No gallop.   Pulmonary:     Effort: Pulmonary effort is normal. No respiratory distress.     Breath sounds: Normal breath sounds. No wheezing or rales.  Chest:     Chest wall: No tenderness.     Comments: PT declined exam, she sees OBGYN Abdominal:     Palpations: Abdomen is soft.     Tenderness: There is no abdominal tenderness. There is no guarding.  Musculoskeletal: Normal range of motion.  Lymphadenopathy:     Cervical: No cervical adenopathy.  Skin:    General: Skin  is warm and dry.  Neurological:     Mental Status: She is alert and oriented to person, place, and time.     Cranial Nerves: No cranial nerve deficit.  Psychiatric:        Behavior: Behavior normal.        Thought Content: Thought content normal.        Judgment: Judgment normal.      LABS: Recent Results (from the past 2160 hour(s))  UA/M w/rflx Culture, Routine     Status: Abnormal (Preliminary result)   Collection Time: 01/30/18 11:55 AM  Result Value Ref Range   Specific Gravity, UA 1.024 1.005 - 1.030   pH, UA 6.0 5.0 - 7.5   Color, UA Yellow Yellow   Appearance Ur Cloudy (A) Clear   Leukocytes, UA 2+ (A) Negative   Protein, UA Trace Negative/Trace   Glucose, UA Negative Negative   Ketones, UA Trace (A) Negative   RBC, UA Negative Negative   Bilirubin, UA Negative Negative   Urobilinogen, Ur 0.2 0.2 - 1.0 mg/dL   Nitrite, UA Negative Negative   Microscopic Examination See below:     Comment: Microscopic was indicated and was performed.   Urinalysis Reflex Comment     Comment: This specimen has reflexed to a Urine Culture.  Microscopic Examination     Status: Abnormal   Collection Time: 01/30/18 11:55 AM  Result Value Ref Range   WBC, UA >30 (A) 0 - 5 /hpf   RBC, UA 3-10 (A) 0 - 2 /hpf   Epithelial Cells (non renal) >10 (A) 0 - 10 /hpf   Casts None seen None seen /lpf   Mucus, UA Present Not Estab.   Bacteria, UA Few None seen/Few  Urine Culture, Reflex     Status: None (Preliminary result)   Collection Time: 01/30/18 11:55 AM  Result Value Ref Range   Urine Culture, Routine WILL FOLLOW      Assessment/Plan: 1. Encounter for general adult medical examination with abnormal findings Patient up-to-date on preventative health maintenance. Labs ordered and will be reviewed as results available. - CBC with Differential/Platelet - Lipid Panel With LDL/HDL Ratio - TSH - T4, free - Comprehensive metabolic panel  2. Gastroesophageal reflux disease without  esophagitis Stable, patient is to continue dietary changes and taking OTC medications as needed.  3. Nonintractable headache, unspecified chronicity pattern, unspecified headache type Patient denies any recent headaches at this time.  She is continuing to manage with diet as well as of activities.  4. Fatigue, unspecified type Resolved at this time.  Will continue monitor  5. Dysuria - UA/M w/rflx Culture, Routine  General Counseling: Joud verbalizes understanding of the findings of todays visit and agrees with plan of treatment. I have discussed any further diagnostic evaluation that may be needed or ordered today. We also reviewed her medications today. she has been  encouraged to call the office with any questions or concerns that should arise related to todays visit.   Orders Placed This Encounter  Procedures  . Microscopic Examination  . Urine Culture, Reflex  . CBC with Differential/Platelet  . Lipid Panel With LDL/HDL Ratio  . TSH  . T4, free  . Comprehensive metabolic panel  . UA/M w/rflx Culture, Routine    No orders of the defined types were placed in this encounter.   Time spent: 35 Minutes   This patient was seen by Blima Ledger AGNP-C in Collaboration with Dr Lyndon Code as a part of collaborative care agreement    Nicole Richardson AGNP-C Internal Medicine

## 2018-01-30 NOTE — Patient Instructions (Signed)

## 2018-01-31 ENCOUNTER — Encounter: Payer: Self-pay | Admitting: Adult Health

## 2018-02-02 DIAGNOSIS — Z0001 Encounter for general adult medical examination with abnormal findings: Secondary | ICD-10-CM | POA: Diagnosis not present

## 2018-02-03 LAB — LIPID PANEL WITH LDL/HDL RATIO
CHOLESTEROL TOTAL: 146 mg/dL (ref 100–199)
HDL: 49 mg/dL (ref >39)
LDL Calculated: 73 mg/dL (ref 0–99)
LDl/HDL Ratio: 1.5 ratio (ref 0.0–3.2)
Triglycerides: 122 mg/dL (ref 0–149)
VLDL CHOLESTEROL CAL: 24 mg/dL (ref 5–40)

## 2018-02-03 LAB — CBC WITH DIFFERENTIAL/PLATELET
BASOS: 1 %
Basophils Absolute: 0 10*3/uL (ref 0.0–0.2)
EOS (ABSOLUTE): 0.1 10*3/uL (ref 0.0–0.4)
Eos: 2 %
Hematocrit: 38.2 % (ref 34.0–46.6)
Hemoglobin: 13 g/dL (ref 11.1–15.9)
Immature Grans (Abs): 0 10*3/uL (ref 0.0–0.1)
Immature Granulocytes: 0 %
LYMPHS ABS: 2.8 10*3/uL (ref 0.7–3.1)
LYMPHS: 44 %
MCH: 30.9 pg (ref 26.6–33.0)
MCHC: 34 g/dL (ref 31.5–35.7)
MCV: 91 fL (ref 79–97)
MONOCYTES: 6 %
Monocytes Absolute: 0.4 10*3/uL (ref 0.1–0.9)
NEUTROS ABS: 3.1 10*3/uL (ref 1.4–7.0)
Neutrophils: 47 %
Platelets: 361 10*3/uL (ref 150–450)
RBC: 4.21 x10E6/uL (ref 3.77–5.28)
RDW: 12.1 % (ref 11.7–15.4)
WBC: 6.5 10*3/uL (ref 3.4–10.8)

## 2018-02-03 LAB — UA/M W/RFLX CULTURE, ROUTINE
Bilirubin, UA: NEGATIVE
GLUCOSE, UA: NEGATIVE
NITRITE UA: NEGATIVE
PH UA: 6 (ref 5.0–7.5)
RBC UA: NEGATIVE
SPEC GRAV UA: 1.024 (ref 1.005–1.030)
Urobilinogen, Ur: 0.2 mg/dL (ref 0.2–1.0)

## 2018-02-03 LAB — MICROSCOPIC EXAMINATION
Casts: NONE SEEN /lpf
WBC, UA: >30 /hpf — AB (ref 0–5)

## 2018-02-03 LAB — COMPREHENSIVE METABOLIC PANEL
A/G RATIO: 2.1 (ref 1.2–2.2)
ALK PHOS: 42 IU/L (ref 39–117)
ALT: 14 IU/L (ref 0–32)
AST: 20 IU/L (ref 0–40)
Albumin: 4.7 g/dL (ref 3.5–5.5)
BUN/Creatinine Ratio: 7 — ABNORMAL LOW (ref 9–23)
BUN: 7 mg/dL (ref 6–20)
Bilirubin Total: 0.5 mg/dL (ref 0.0–1.2)
CALCIUM: 9.5 mg/dL (ref 8.7–10.2)
CO2: 21 mmol/L (ref 20–29)
Chloride: 102 mmol/L (ref 96–106)
Creatinine, Ser: 1.01 mg/dL — ABNORMAL HIGH (ref 0.57–1.00)
GFR calc Af Amer: 89 mL/min/{1.73_m2} (ref >59)
GFR, EST NON AFRICAN AMERICAN: 78 mL/min/{1.73_m2} (ref >59)
GLOBULIN, TOTAL: 2.2 g/dL (ref 1.5–4.5)
Glucose: 85 mg/dL (ref 65–99)
POTASSIUM: 4 mmol/L (ref 3.5–5.2)
SODIUM: 139 mmol/L (ref 134–144)
Total Protein: 6.9 g/dL (ref 6.0–8.5)

## 2018-02-03 LAB — TSH: TSH: 1.53 u[IU]/mL (ref 0.450–4.500)

## 2018-02-03 LAB — URINE CULTURE, REFLEX

## 2018-02-03 LAB — T4, FREE: FREE T4: 1.19 ng/dL (ref 0.82–1.77)

## 2018-02-10 ENCOUNTER — Telehealth: Payer: Self-pay | Admitting: Nurse Practitioner

## 2018-02-10 NOTE — Telephone Encounter (Signed)
Patient called regarding her labs, my chart is giving her trouble , but patient is wanting to know if anything looks bad ?

## 2018-02-10 NOTE — Telephone Encounter (Signed)
Based on the blood work results I see, everything looks good.

## 2018-02-10 NOTE — Telephone Encounter (Signed)
Pt advised it looks good

## 2018-03-12 ENCOUNTER — Encounter: Payer: Self-pay | Admitting: Adult Health

## 2018-03-12 ENCOUNTER — Ambulatory Visit (INDEPENDENT_AMBULATORY_CARE_PROVIDER_SITE_OTHER): Payer: 59 | Admitting: Adult Health

## 2018-03-12 VITALS — BP 110/72 | HR 95 | Temp 99.0°F | Resp 16 | Ht 64.0 in | Wt 118.0 lb

## 2018-03-12 DIAGNOSIS — K219 Gastro-esophageal reflux disease without esophagitis: Secondary | ICD-10-CM

## 2018-03-12 DIAGNOSIS — F411 Generalized anxiety disorder: Secondary | ICD-10-CM | POA: Diagnosis not present

## 2018-03-12 MED ORDER — SERTRALINE HCL 50 MG PO TABS: 50.0000 mg | ORAL_TABLET | Freq: Every day | ORAL | 2 refills | Status: DC

## 2018-03-12 NOTE — Patient Instructions (Signed)
Living With Anxiety    After being diagnosed with an anxiety disorder, you may be relieved to know why you have felt or behaved a certain way. It is natural to also feel overwhelmed about the treatment ahead and what it will mean for your life. With care and support, you can manage this condition and recover from it.  How to cope with anxiety  Dealing with stress  Stress is your body’s reaction to life changes and events, both good and bad. Stress can last just a few hours or it can be ongoing. Stress can play a major role in anxiety, so it is important to learn both how to cope with stress and how to think about it differently.  Talk with your health care provider or a counselor to learn more about stress reduction. He or she may suggest some stress reduction techniques, such as:  · Music therapy. This can include creating or listening to music that you enjoy and that inspires you.  · Mindfulness-based meditation. This involves being aware of your normal breaths, rather than trying to control your breathing. It can be done while sitting or walking.  · Centering prayer. This is a kind of meditation that involves focusing on a word, phrase, or sacred image that is meaningful to you and that brings you peace.  · Deep breathing. To do this, expand your stomach and inhale slowly through your nose. Hold your breath for 3-5 seconds. Then exhale slowly, allowing your stomach muscles to relax.  · Self-talk. This is a skill where you identify thought patterns that lead to anxiety reactions and correct those thoughts.  · Muscle relaxation. This involves tensing muscles then relaxing them.  Choose a stress reduction technique that fits your lifestyle and personality. Stress reduction techniques take time and practice. Set aside 5-15 minutes a day to do them. Therapists can offer training in these techniques. The training may be covered by some insurance plans. Other things you can do to manage stress include:  · Keeping a  stress diary. This can help you learn what triggers your stress and ways to control your response.  · Thinking about how you respond to certain situations. You may not be able to control everything, but you can control your reaction.  · Making time for activities that help you relax, and not feeling guilty about spending your time in this way.  Therapy combined with coping and stress-reduction skills provides the best chance for successful treatment.  Medicines  Medicines can help ease symptoms. Medicines for anxiety include:  · Anti-anxiety drugs.  · Antidepressants.  · Beta-blockers.  Medicines may be used as the main treatment for anxiety disorder, along with therapy, or if other treatments are not working. Medicines should be prescribed by a health care provider.  Relationships  Relationships can play a big part in helping you recover. Try to spend more time connecting with trusted friends and family members. Consider going to couples counseling, taking family education classes, or going to family therapy. Therapy can help you and others better understand the condition.  How to recognize changes in your condition  Everyone has a different response to treatment for anxiety. Recovery from anxiety happens when symptoms decrease and stop interfering with your daily activities at home or work. This may mean that you will start to:  · Have better concentration and focus.  · Sleep better.  · Be less irritable.  · Have more energy.  · Have improved memory.  It is   important to recognize when your condition is getting worse. Contact your health care provider if your symptoms interfere with home or work and you do not feel like your condition is improving.  Where to find help and support:  You can get help and support from these sources:  · Self-help groups.  · Online and community organizations.  · A trusted spiritual leader.  · Couples counseling.  · Family education classes.  · Family therapy.  Follow these instructions  at home:  · Eat a healthy diet that includes plenty of vegetables, fruits, whole grains, low-fat dairy products, and lean protein. Do not eat a lot of foods that are high in solid fats, added sugars, or salt.  · Exercise. Most adults should do the following:  ? Exercise for at least 150 minutes each week. The exercise should increase your heart rate and make you sweat (moderate-intensity exercise).  ? Strengthening exercises at least twice a week.  · Cut down on caffeine, tobacco, alcohol, and other potentially harmful substances.  · Get the right amount and quality of sleep. Most adults need 7-9 hours of sleep each night.  · Make choices that simplify your life.  · Take over-the-counter and prescription medicines only as told by your health care provider.  · Avoid caffeine, alcohol, and certain over-the-counter cold medicines. These may make you feel worse. Ask your pharmacist which medicines to avoid.  · Keep all follow-up visits as told by your health care provider. This is important.  Questions to ask your health care provider  · Would I benefit from therapy?  · How often should I follow up with a health care provider?  · How long do I need to take medicine?  · Are there any long-term side effects of my medicine?  · Are there any alternatives to taking medicine?  Contact a health care provider if:  · You have a hard time staying focused or finishing daily tasks.  · You spend many hours a day feeling worried about everyday life.  · You become exhausted by worry.  · You start to have headaches, feel tense, or have nausea.  · You urinate more than normal.  · You have diarrhea.  Get help right away if:  · You have a racing heart and shortness of breath.  · You have thoughts of hurting yourself or others.  If you ever feel like you may hurt yourself or others, or have thoughts about taking your own life, get help right away. You can go to your nearest emergency department or call:  · Your local emergency services  (911 in the [place]  · A suicide crisis helpline, such as the National Suicide Prevention Lifeline at 1-800-273-8255. This is open 24-hours a day.  Summary  · Taking steps to deal with stress can help calm you.  · Medicines cannot cure anxiety disorders, but they can help ease symptoms.  · Family, friends, and partners can play a big part in helping you recover from an anxiety disorder.  This information is not intended to replace advice given to you by your health care provider. Make sure you discuss any questions you have with your health care provider.  Document Released: 01/09/2016 Document Revised: 01/09/2016 Document Reviewed: 01/09/2016  [employer]vier Interactive Patient Education © 2019 [employer]

## 2018-03-12 NOTE — Progress Notes (Signed)
[facility] [address] [place], [place] [address]  Internal MEDICINE  Office Visit Note  Patient Name: Nicole Richardson  [id number]  [id number]  Date of Service: 03/12/2018  Chief Complaint  Patient presents with  . Anxiety     HPI Pt is here for a sick visit. Pt reports a long history of anxiety.  In college she was taking Zoloft.  She reports she tapered herself off and has done well for the past four years.  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  She reports multiple panic attacks recently that she describes as shaking, and feeling stuck, breathing rapidly, and unable to catch her breath.  These episodes typically last 20-30 minutes.      Current Medication:  Outpatient Encounter Medications as of 03/12/2018  Medication Sig  . norethindrone-ethinyl estradiol (MICROGESTIN,JUNEL,LOESTRIN) 1-20 MG-MCG tablet Take 1 tablet by mouth daily.  . sertraline (ZOLOFT) 50 MG tablet Take 1 tablet (50 mg total) by mouth daily.   No facility-administered encounter medications on file as of 03/12/2018.       Medical History: Past Medical History:  Diagnosis Date  . Acid reflux      Vital Signs: BP 110/72   Pulse 95   Temp 99 F (37.2 C) (Oral)   Resp 16   Ht 5\' 4"  (1.626 m)   Wt 118 lb (53.5 kg)   SpO2 99%   BMI 20.25 kg/m    Review of Systems  Constitutional: Negative for chills, fatigue and unexpected weight change.  HENT: Negative for congestion, rhinorrhea, sneezing and sore throat.   Eyes: Negative for photophobia, pain and redness.  Respiratory: Negative for cough, chest tightness and shortness of breath.   Cardiovascular: Negative for chest pain and palpitations.  Gastrointestinal: Negative for abdominal pain, constipation, diarrhea, nausea and vomiting.  Endocrine: Negative.   Genitourinary: Negative for dysuria and frequency.  Musculoskeletal: Negative for arthralgias, back pain,  joint swelling and neck pain.  Skin: Negative for rash.  Allergic/Immunologic: Negative.   Neurological: Negative for tremors and numbness.  Hematological: Negative for adenopathy. Does not bruise/bleed easily.  Psychiatric/Behavioral: Negative for behavioral problems and sleep disturbance. The patient is not nervous/anxious.     Physical Exam Vitals signs and nursing note reviewed.  Constitutional:      General: She is not in acute distress.    Appearance: She is well-developed. She is not diaphoretic.  HENT:     Head: Normocephalic and atraumatic.     Mouth/Throat:     Pharynx: No oropharyngeal exudate.  Eyes:     Pupils: Pupils are equal, round, and reactive to light.  Neck:     Musculoskeletal: Normal range of motion and neck supple.     Thyroid: No thyromegaly.     Vascular: No JVD.     Trachea: No tracheal deviation.  Cardiovascular:     Rate and Rhythm: Normal rate and regular rhythm.     Heart sounds: Normal heart sounds. No murmur. No friction rub. No gallop.   Pulmonary:     Effort: Pulmonary effort is normal. No respiratory distress.     Breath sounds: Normal breath sounds. No wheezing or rales.  Chest:     Chest wall: No tenderness.  Abdominal:     Palpations: Abdomen is soft.     Tenderness: There is no abdominal tenderness. There is no guarding.  Musculoskeletal: Normal range of motion.  Lymphadenopathy:  Cervical: No cervical adenopathy.  Skin:    General: Skin is warm and dry.  Neurological:     Mental Status: She is alert and oriented to person, place, and time.     Cranial Nerves: No cranial nerve deficit.  Psychiatric:        Behavior: Behavior normal.        Thought Content: Thought content normal.        Judgment: Judgment normal.    Assessment/Plan: 1. GAD (generalized anxiety disorder) Patient prescribed a course of Zoloft.  She reports she is most comfortable with because she had no side effects that she recalls from taking it during  college.  I instructed her to take half a tablet for the first 2 weeks and then continue on with a whole tablet as previous dose was 50 mg.  We discussed side effects to watch for and she will follow-up in 6 weeks for effectiveness. - sertraline (ZOLOFT) 50 MG tablet; Take 1 tablet (50 mg total) by mouth daily.  Dispense: 30 tablet; Refill: 2  2. Gastroesophageal reflux disease without esophagitis Has been having sympotoms that she has been using tums and OTC nexium as needed.  She believes her anxiety is making her GERD symptoms worse.   General Counseling: Athel verbalizes understanding of the findings of todays visit and agrees with plan of treatment. I have discussed any further diagnostic evaluation that may be needed or ordered today. We also reviewed her medications today. she has been encouraged to call the office with any questions or concerns that should arise related to todays visit.   No orders of the defined types were placed in this encounter.   Meds ordered this encounter  Medications  . sertraline (ZOLOFT) 50 MG tablet    Sig: Take 1 tablet (50 mg total) by mouth daily.    Dispense:  30 tablet    Refill:  2    Time spent: 25 Minutes  This patient was seen by Blima Ledger AGNP-C in Collaboration with Dr Lyndon Code as a part of collaborative care agreement.  Johnna Acosta AGNP-C Internal Medicine

## 2018-03-24 DIAGNOSIS — R1013 Epigastric pain: Secondary | ICD-10-CM | POA: Diagnosis not present

## 2018-03-24 DIAGNOSIS — R110 Nausea: Secondary | ICD-10-CM | POA: Diagnosis not present

## 2018-04-03 ENCOUNTER — Other Ambulatory Visit: Payer: Self-pay

## 2018-04-03 DIAGNOSIS — F411 Generalized anxiety disorder: Secondary | ICD-10-CM

## 2018-04-03 MED ORDER — SERTRALINE HCL 50 MG PO TABS: 50.0000 mg | ORAL_TABLET | Freq: Every day | ORAL | 0 refills | Status: DC

## 2018-04-07 DIAGNOSIS — J302 Other seasonal allergic rhinitis: Secondary | ICD-10-CM | POA: Diagnosis not present

## 2018-04-07 DIAGNOSIS — J029 Acute pharyngitis, unspecified: Secondary | ICD-10-CM | POA: Diagnosis not present

## 2018-04-23 ENCOUNTER — Ambulatory Visit: Payer: Self-pay | Admitting: Adult Health

## 2018-04-26 ENCOUNTER — Other Ambulatory Visit: Payer: Self-pay | Admitting: Gynecology

## 2018-04-30 ENCOUNTER — Other Ambulatory Visit: Payer: Self-pay | Admitting: Gynecology

## 2018-05-01 ENCOUNTER — Other Ambulatory Visit: Payer: Self-pay | Admitting: Gynecology

## 2018-06-09 DIAGNOSIS — M25521 Pain in right elbow: Secondary | ICD-10-CM | POA: Diagnosis not present

## 2018-06-16 DIAGNOSIS — M25521 Pain in right elbow: Secondary | ICD-10-CM | POA: Diagnosis not present

## 2018-06-23 DIAGNOSIS — M25521 Pain in right elbow: Secondary | ICD-10-CM | POA: Diagnosis not present

## 2018-07-22 ENCOUNTER — Other Ambulatory Visit: Payer: Self-pay | Admitting: Adult Health

## 2018-07-22 DIAGNOSIS — F411 Generalized anxiety disorder: Secondary | ICD-10-CM

## 2018-07-23 ENCOUNTER — Ambulatory Visit (INDEPENDENT_AMBULATORY_CARE_PROVIDER_SITE_OTHER): Payer: 59 | Admitting: Adult Health

## 2018-07-23 ENCOUNTER — Other Ambulatory Visit: Payer: Self-pay

## 2018-07-23 ENCOUNTER — Encounter: Payer: Self-pay | Admitting: Adult Health

## 2018-07-23 VITALS — Ht 64.0 in | Wt 120.0 lb

## 2018-07-23 DIAGNOSIS — F411 Generalized anxiety disorder: Secondary | ICD-10-CM | POA: Diagnosis not present

## 2018-07-23 DIAGNOSIS — K219 Gastro-esophageal reflux disease without esophagitis: Secondary | ICD-10-CM | POA: Diagnosis not present

## 2018-07-23 MED ORDER — PANTOPRAZOLE SODIUM 40 MG PO TBEC: 40.0000 mg | DELAYED_RELEASE_TABLET | Freq: Every day | ORAL | 3 refills | Status: DC

## 2018-07-23 MED ORDER — SERTRALINE HCL 50 MG PO TABS: 50.0000 mg | ORAL_TABLET | Freq: Every day | ORAL | 1 refills | Status: DC

## 2018-07-23 NOTE — Progress Notes (Signed)
[facility] [address] [place], [place] [address]  Internal MEDICINE  Telephone Visit  Patient Name: Nicole Richardson  1992/09/23  [id number]  Date of Service: 07/23/2018  I connected with the patient at 1500 by telephone and verified the patients identity using two identifiers.   I discussed the limitations, risks, security and privacy concerns of performing an evaluation and management service by telephone and the availability of in person appointments. I also discussed with the patient that there may be a patient responsible charge related to the service.  The patient expressed understanding and agrees to proceed.    Chief Complaint  Patient presents with  . Anxiety  . Follow-up    HPI  Pt seen via telephone for follow up on GERD and anxiety.  She reports some increased anxiety symptoms due to covid, however she continues to feel like it is manageable with zoloft.  She denies further needs at this time.  Her gerd is well controlled with a daily medication and refills are provided at this time.     Current Medication: Outpatient Encounter Medications as of 07/23/2018  Medication Sig  . esomeprazole (NEXIUM) 40 MG capsule TAKE 1 CAPSULE BY MOUTH EVERY DAY  . JUNEL 1/20 1-20 MG-MCG tablet TAKE 1 TABLET BY MOUTH EVERY DAY  . sertraline (ZOLOFT) 50 MG tablet TAKE 1 TABLET BY MOUTH EVERY DAY   No facility-administered encounter medications on file as of 07/23/2018.     Surgical History: Past Surgical History:  Procedure Laterality Date  . ADENOIDECTOMY    . Broken arm    . CHOLECYSTECTOMY    . WISDOM TOOTH EXTRACTION      Medical History: Past Medical History:  Diagnosis Date  . Acid reflux   . Anxiety     Family History: Family History  Problem Relation Age of Onset  . Cancer Paternal Grandfather        melanoma  . Lung cancer Paternal Grandfather   . Alcohol abuse Maternal Grandmother   . Cancer Maternal Grandfather        Colon    Social History    Socioeconomic History  . Marital status: Married    Spouse name: Not on file  . Number of children: Not on file  . Years of education: Not on file  . Highest education level: Not on file  Occupational History  . Not on file  Social Needs  . Financial resource strain: Not on file  . Food insecurity    Worry: Not on file    Inability: Not on file  . Transportation needs    Medical: Not on file    Non-medical: Not on file  Tobacco Use  . Smoking status: Never Smoker  . Smokeless tobacco: Never Used  Substance and Sexual Activity  . Alcohol use: Yes    Alcohol/week: 0.0 standard drinks    Comment: Rarely  . Drug use: No  . Sexual activity: Yes    Birth control/protection: Pill    Comment: 1st intercourse 28 yo-1 partner  Lifestyle  . Physical activity    Days per week: Not on file    Minutes per session: Not on file  . Stress: Not on file  Relationships  . Social Musicianconnections    Talks on phone: Not on file    Gets together: Not on file    Attends religious service: Not on file    Active member of club or organization: Not on file    Attends meetings  of clubs or organizations: Not on file    Relationship status: Not on file  . Intimate partner violence    Fear of current or ex partner: Not on file    Emotionally abused: Not on file    Physically abused: Not on file    Forced sexual activity: Not on file  Other Topics Concern  . Not on file  Social History Narrative  . Not on file      Review of Systems  Constitutional: Negative for chills, fatigue and unexpected weight change.  HENT: Negative for congestion, rhinorrhea, sneezing and sore throat.   Eyes: Negative for photophobia, pain and redness.  Respiratory: Negative for cough, chest tightness and shortness of breath.   Cardiovascular: Negative for chest pain and palpitations.  Gastrointestinal: Negative for abdominal pain, constipation, diarrhea, nausea and vomiting.  Endocrine: Negative.   Genitourinary:  Negative for dysuria and frequency.  Musculoskeletal: Negative for arthralgias, back pain, joint swelling and neck pain.  Skin: Negative for rash.  Allergic/Immunologic: Negative.   Neurological: Negative for tremors and numbness.  Hematological: Negative for adenopathy. Does not bruise/bleed easily.  Psychiatric/Behavioral: Negative for behavioral problems and sleep disturbance. The patient is not nervous/anxious.     Vital Signs: Ht 5\' 4"  (1.626 m)   Wt 120 lb (54.4 kg)   BMI 20.60 kg/m    Observation/Objective:  Speaking in full sentences, NAD noted.   Assessment/Plan: 1. Gastroesophageal reflux disease without esophagitis Pts insurance does not pay for Nexium, will try Protonix  IF it is too expensive patient will continue with OTC version of Nexium.  - pantoprazole (PROTONIX) 40 MG tablet; Take 1 tablet (40 mg total) by mouth daily.  Dispense: 30 tablet; Refill: 3  2. GAD (generalized anxiety disorder) Refilled patients zoloft, continue to take as discussed.  - sertraline (ZOLOFT) 50 MG tablet; Take 1 tablet (50 mg total) by mouth daily.  Dispense: 90 tablet; Refill: 1  General Counseling: Nicole Richardson verbalizes understanding of the findings of today's phone visit and agrees with plan of treatment. I have discussed any further diagnostic evaluation that may be needed or ordered today. We also reviewed her medications today. she has been encouraged to call the office with any questions or concerns that should arise related to todays visit.    No orders of the defined types were placed in this encounter.   No orders of the defined types were placed in this encounter.   Time spent:15 Minutes   Orson Gear AGNP-C Internal medicine

## 2018-09-02 ENCOUNTER — Other Ambulatory Visit: Payer: Self-pay

## 2018-09-02 ENCOUNTER — Ambulatory Visit: Payer: 59 | Admitting: Adult Health

## 2018-10-19 ENCOUNTER — Encounter: Payer: Self-pay | Admitting: Gynecology

## 2018-11-05 ENCOUNTER — Other Ambulatory Visit: Payer: Self-pay

## 2018-11-05 ENCOUNTER — Encounter: Payer: Self-pay | Admitting: Gynecology

## 2018-11-05 ENCOUNTER — Ambulatory Visit (INDEPENDENT_AMBULATORY_CARE_PROVIDER_SITE_OTHER): Payer: 59 | Admitting: Gynecology

## 2018-11-05 VITALS — BP 118/74 | Ht 64.0 in | Wt 127.0 lb

## 2018-11-05 DIAGNOSIS — Z01419 Encounter for gynecological examination (general) (routine) without abnormal findings: Secondary | ICD-10-CM | POA: Diagnosis not present

## 2018-11-05 MED ORDER — NORETHINDRONE ACET-ETHINYL EST 1-20 MG-MCG PO TABS: 1.0000 | ORAL_TABLET | Freq: Every day | ORAL | 4 refills | Status: AC

## 2018-11-05 NOTE — Addendum Note (Signed)
Addended by: Nelva Nay on: 11/05/2018 10:18 AM   Modules accepted: Orders

## 2018-11-05 NOTE — Patient Instructions (Signed)
Follow-up in 1 year for annual exam, sooner if any issues. 

## 2018-11-05 NOTE — Progress Notes (Signed)
    Nicole Richardson 1991/01/25 [id number]        28 y.o.  G0P0000 for annual gynecologic exam.  Without gynecologic complaints  Past medical history,surgical history, problem list, medications, allergies, family history and social history were all reviewed and documented as reviewed in the EPIC chart.  ROS:  Performed with pertinent positives and negatives included in the history, assessment and plan.   Additional significant findings : None   Exam: Caryn Bee assistant Vitals:   11/05/18 0956  BP: 118/74  Weight: 127 lb (57.6 kg)  Height: 5\' 4"  (1.626 m)   Body mass index is 21.8 kg/m.  General appearance:  Normal affect, orientation and appearance. Skin: Grossly normal HEENT: Without gross lesions.  No cervical or supraclavicular adenopathy. Thyroid normal.  Lungs:  Clear without wheezing, rales or rhonchi Cardiac: RR, without RMG Abdominal:  Soft, nontender, without masses, guarding, rebound, organomegaly or hernia Breasts:  Examined lying and sitting without masses, retractions, discharge or axillary adenopathy. Pelvic:  Ext, BUS, Vagina: Normal  Cervix: Normal.  Pap smear done  Uterus: Anteverted, normal size, shape and contour, midline and mobile nontender   Adnexa: Without masses or tenderness    Anus and perineum: Normal    Assessment/Plan:  29 y.o. G0P0000 female for annual gynecologic exam.  With regular menses, oral contraceptives.  1. Oral contraceptives.  Doing well on Junel 1/20.  Refill x1 year provided. 2. STD screening discussed offered and declined. 3. Gardasil series reportedly received. 4. Breast health.  Breast exam normal today.  SBE monthly reviewed. 5. Pap smear 06/2016.  Pap smear done today.  No history of abnormal Pap smears previously. 6. Health maintenance.  Had routine lab work done through primary provider's office this year.  Follow-up 1 year, sooner as needed    Anastasio Auerbach MD, 10:11 AM 11/05/2018

## 2018-11-06 LAB — PAP IG W/ RFLX HPV ASCU

## 2018-11-23 ENCOUNTER — Ambulatory Visit: Payer: 59 | Admitting: Adult Health

## 2018-11-24 ENCOUNTER — Encounter: Payer: Self-pay | Admitting: Nurse Practitioner

## 2018-11-24 ENCOUNTER — Ambulatory Visit: Payer: 59 | Admitting: Nurse Practitioner

## 2018-11-24 ENCOUNTER — Other Ambulatory Visit: Payer: Self-pay

## 2018-11-24 VITALS — BP 109/72 | HR 87 | Temp 97.9°F | Resp 16 | Ht 64.0 in | Wt 129.8 lb

## 2018-11-24 DIAGNOSIS — M25512 Pain in left shoulder: Secondary | ICD-10-CM

## 2018-11-24 MED ORDER — PREDNISONE 10 MG (21) PO TBPK: ORAL_TABLET | ORAL | 0 refills | Status: AC

## 2018-11-24 MED ORDER — MELOXICAM 7.5 MG PO TABS: 7.5000 mg | ORAL_TABLET | Freq: Every day | ORAL | 2 refills | Status: AC

## 2018-11-24 NOTE — Progress Notes (Signed)
[facility] [place], [place] [address]  Internal MEDICINE  Office Visit Note  Patient Name: Nicole Richardson  [id number]  [id number]  Date of Service: 12/09/2018    Pt is here for a sick visit.  Chief Complaint  Patient presents with  . 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 She states that last week, she had a heavy patient. Was helping the patient to sit up and felt pull in her left upper arm, near the shoulder. The pain is persistent. She is unable to lift the arm past 45 degress without increased pain. Rotation at the shoulder is very painful. Pulling or pushing with exertion also increase the pain. She has been icing the shoulder. She has also been taking OTC tylenol and ibuprofen without relief .      Current Medication:  Outpatient Encounter Medications as of 11/24/2018  Medication Sig  . Cetirizine HCl (ZYRTEC PO) Take by mouth.  . esomeprazole (NEXIUM) 20 MG capsule Take 20 mg by mouth daily at 12 noon.  . norethindrone-ethinyl estradiol (JUNEL 1/20) 1-20 MG-MCG tablet Take 1 tablet by mouth daily.  . [DISCONTINUED] sertraline (ZOLOFT) 50 MG tablet Take 1 tablet (50 mg total) by mouth daily.  . meloxicam (MOBIC) 7.5 MG tablet Take 1 tablet (7.5 mg total) by mouth daily.  . predniSONE (STERAPRED UNI-PAK 21 TAB) 10 MG (21) TBPK tablet 6 day taper - take by mouth as directed for 6 days   No facility-administered encounter medications on file as of 11/24/2018.       Medical History: Past Medical History:  Diagnosis Date  . Acid reflux   . Anxiety      Today's Vitals   11/24/18 1147  BP: 109/72  Pulse: 87  Resp: 16  Temp: 97.9 F (36.6 C)  SpO2: 98%  Weight: 129 lb 12.8 oz (58.9 kg)  Height: 5\' 4"  (1.626 m)   Body mass index is 22.28 kg/m.  Review of Systems   Constitutional: Negative for activity change, chills, fatigue and unexpected weight change.  HENT: Negative for congestion, postnasal drip, rhinorrhea, sneezing and sore throat.   Respiratory: Negative for cough, chest tightness, shortness of breath and wheezing.   Cardiovascular: Negative for chest pain and palpitations.  Gastrointestinal: Negative for abdominal pain, constipation, diarrhea, nausea and vomiting.  Musculoskeletal: Positive for arthralgias. Negative for back pain, joint swelling and neck pain.       Left shoulder pain  Skin: Negative for rash.  Neurological: Negative.  Negative for tremors and numbness.  Hematological: Negative for adenopathy. Does not bruise/bleed easily.  Psychiatric/Behavioral: Negative for behavioral problems (Depression), sleep disturbance and suicidal ideas. The patient is not nervous/anxious.     Physical Exam Vitals signs and nursing note reviewed.  Constitutional:      General: She is not in acute distress.    Appearance: Normal appearance. She is well-developed. She is not diaphoretic.  HENT:     Head: Normocephalic and atraumatic.     Mouth/Throat:     Pharynx: No oropharyngeal exudate.  Eyes:     Pupils: Pupils are equal, round, and reactive to light.  Neck:     Musculoskeletal: Normal range of motion and neck supple.     Thyroid: No thyromegaly.     Vascular: No JVD.  Trachea: No tracheal deviation.  Cardiovascular:     Rate and Rhythm: Normal rate and regular rhythm.     Heart sounds: Normal heart sounds. No murmur. No friction rub. No gallop.   Pulmonary:     Effort: Pulmonary effort is normal. No respiratory distress.     Breath sounds: Normal breath sounds. No wheezing or rales.  Chest:     Chest wall: No tenderness.  Abdominal:     Palpations: Abdomen is soft.  Musculoskeletal: Normal range of motion.     Left upper arm: She exhibits tenderness.       Arms:  Lymphadenopathy:     Cervical: No cervical adenopathy.   Skin:    General: Skin is warm and dry.  Neurological:     Mental Status: She is alert and oriented to person, place, and time.     Cranial Nerves: No cranial nerve deficit.  Psychiatric:        Behavior: Behavior normal.        Thought Content: Thought content normal.        Judgment: Judgment normal.    Assessment/Plan: 1. Acute pain of left shoulder Start meloxicam 7.5mg  daily. Add prednisone taper. Take as directed for 6 days. Apply ice/heat to affected area to reduce pain and swelling. Refer to orthopedics for further evaluation.  - predniSONE (STERAPRED UNI-PAK 21 TAB) 10 MG (21) TBPK tablet; 6 day taper - take by mouth as directed for 6 days  Dispense: 21 tablet; Refill: 0 - meloxicam (MOBIC) 7.5 MG tablet; Take 1 tablet (7.5 mg total) by mouth daily.  Dispense: 30 tablet; Refill: 2 - Ambulatory referral to Orthopedic Surgery  General Counseling: Lya verbalizes understanding of the findings of todays visit and agrees with plan of treatment. I have discussed any further diagnostic evaluation that may be needed or ordered today. We also reviewed her medications today. she has been encouraged to call the office with any questions or concerns that should arise related to todays visit.    Counseling:   This patient was seen by Vincent Gros FNP Collaboration with Dr Lyndon Code as a part of collaborative care agreement  Orders Placed This Encounter  Procedures  . Ambulatory referral to Orthopedic Surgery    Meds ordered this encounter  Medications  . predniSONE (STERAPRED UNI-PAK 21 TAB) 10 MG (21) TBPK tablet    Sig: 6 day taper - take by mouth as directed for 6 days    Dispense:  21 tablet    Refill:  0    Order Specific Question:   Supervising Provider    Answer:   Lyndon Code [[id number]]  . meloxicam (MOBIC) 7.5 MG tablet    Sig: Take 1 tablet (7.5 mg total) by mouth daily.    Dispense:  30 tablet    Refill:  2    Order Specific Question:   Supervising Provider     Answer:   Lyndon Code [[id number]]    Time spent: 25 Minutes

## 2018-11-26 ENCOUNTER — Other Ambulatory Visit: Payer: Self-pay | Admitting: Adult Health

## 2018-11-26 DIAGNOSIS — F411 Generalized anxiety disorder: Secondary | ICD-10-CM

## 2018-12-09 DIAGNOSIS — M25512 Pain in left shoulder: Secondary | ICD-10-CM | POA: Insufficient documentation

## 2019-02-02 ENCOUNTER — Telehealth: Payer: Self-pay

## 2019-02-02 NOTE — Telephone Encounter (Signed)
Patient cancelled appointment on 02/04/2019 due to financial issues, will call back to reschedule. klh

## 2019-02-04 ENCOUNTER — Encounter: Payer: Self-pay | Admitting: Adult Health

## 2019-02-06 ENCOUNTER — Encounter: Payer: Self-pay | Admitting: Emergency Medicine

## 2019-02-06 ENCOUNTER — Emergency Department
Admission: EM | Admit: 2019-02-06 | Discharge: 2019-02-06 | Disposition: A | Discharge status: Home / Self Care | Payer: BLUE CROSS/BLUE SHIELD | Attending: Student | Admitting: Student

## 2019-02-06 ENCOUNTER — Other Ambulatory Visit: Payer: Self-pay

## 2019-02-06 ENCOUNTER — Emergency Department: Payer: BLUE CROSS/BLUE SHIELD

## 2019-02-06 DIAGNOSIS — Y9259 Other trade areas as the place of occurrence of the external cause: Secondary | ICD-10-CM | POA: Diagnosis not present

## 2019-02-06 DIAGNOSIS — S60221A Contusion of right hand, initial encounter: Secondary | ICD-10-CM

## 2019-02-06 DIAGNOSIS — Y999 Unspecified external cause status: Secondary | ICD-10-CM | POA: Diagnosis not present

## 2019-02-06 DIAGNOSIS — Y9343 Activity, gymnastics: Secondary | ICD-10-CM | POA: Insufficient documentation

## 2019-02-06 DIAGNOSIS — W208XXA Other cause of strike by thrown, projected or falling object, initial encounter: Secondary | ICD-10-CM | POA: Insufficient documentation

## 2019-02-06 DIAGNOSIS — S6991XA Unspecified injury of right wrist, hand and finger(s), initial encounter: Secondary | ICD-10-CM | POA: Diagnosis present

## 2019-02-06 DIAGNOSIS — T1490XA Injury, unspecified, initial encounter: Secondary | ICD-10-CM

## 2019-02-06 DIAGNOSIS — M79641 Pain in right hand: Secondary | ICD-10-CM

## 2019-02-06 MED ORDER — OXYCODONE-ACETAMINOPHEN 5-325 MG PO TABS
1.0000 | ORAL_TABLET | Freq: Once | ORAL | Status: AC
  Administered 2019-02-06: 18:00:00 1 via ORAL
  Filled 2019-02-06: qty 1

## 2019-02-06 MED ORDER — LIDOCAINE 5 % EX PTCH
1.0000 | MEDICATED_PATCH | CUTANEOUS | Status: DC
  Administered 2019-02-06: 1 via TRANSDERMAL
  Filled 2019-02-06: qty 1

## 2019-02-06 MED ORDER — IBUPROFEN 600 MG PO TABS: 600.0000 mg | ORAL_TABLET | Freq: Three times a day (TID) | ORAL | 0 refills | Status: AC | PRN

## 2019-02-06 MED ORDER — TRAMADOL HCL 50 MG PO TABS: 50.0000 mg | ORAL_TABLET | Freq: Four times a day (QID) | ORAL | 0 refills | Status: AC | PRN

## 2019-02-06 NOTE — ED Triage Notes (Signed)
Bar fell on 3rd and 4th fingers R hand one hour ago, severe pain.

## 2019-02-06 NOTE — Discharge Instructions (Addendum)
Follow discharge care instruction take medication as directed.  Wear Lidoderm patch for 12 hours. °

## 2019-02-06 NOTE — ED Provider Notes (Signed)
[facility] Emergency Department Provider Note   ____________________________________________   First MD Initiated Contact with Patient 02/06/19 1604     (approximate)  I have reviewed the triage vital signs and the nursing notes.   HISTORY  Chief Complaint Hand Pain    HPI Nicole Richardson is a 29 y.o. female patient complain of right hand pain involving the third and fourth digit.  Patient say a bar fell on her hand.  Patient rates the pain as a 10/10.  Patient describes the pain as "achy".  Patient states no loss of sensation but decreased range of motion with flexion of the DIPJ.  No palliative measures prior to arrival.  Patient given ice pack in triage.         Past Medical History:  Diagnosis Date  . Acid reflux   . Anxiety     Patient Active Problem List   Diagnosis Date Noted  . Acute pain of left shoulder 12/09/2018  . Dysfunctional gallbladder 06/24/2012  . Acid reflux   . Periods, menstrual, difficult 08/08/2011    Past Surgical History:  Procedure Laterality Date  . ADENOIDECTOMY    . Broken arm    . CHOLECYSTECTOMY    . WISDOM TOOTH EXTRACTION      Prior to Admission medications   Medication Sig Start Date End Date Taking? Authorizing Provider  Cetirizine HCl (ZYRTEC PO) Take by mouth.    [provider]  esomeprazole (NEXIUM) 20 MG capsule Take 20 mg by mouth daily at 12 noon.    [provider]  ibuprofen (ADVIL) 600 MG tablet Take 1 tablet (600 mg total) by mouth every 8 (eight) hours as needed. 02/06/19   Joni Reining, PA-C  meloxicam (MOBIC) 7.5 MG tablet Take 1 tablet (7.5 mg total) by mouth daily. 11/24/18   Carlean Jews, NP  norethindrone-ethinyl estradiol (JUNEL 1/20) 1-20 MG-MCG tablet Take 1 tablet by mouth daily. 11/05/18   Fontaine, Nadyne Coombes, MD  predniSONE (STERAPRED UNI-PAK 21 TAB) 10 MG (21) TBPK tablet 6 day taper - take by mouth as directed for 6 days 11/24/18   Carlean Jews, NP   sertraline (ZOLOFT) 50 MG tablet TAKE 1 TABLET BY MOUTH EVERY DAY 11/26/18   Johnna Acosta, NP  traMADol (ULTRAM) 50 MG tablet Take 1 tablet (50 mg total) by mouth every 6 (six) hours as needed. 02/06/19 02/06/20  Joni Reining, PA-C    Allergies Parke Simmers allergy] and Bactrim [sulfamethoxazole-trimethoprim]  Family History  Problem Relation Age of Onset  . Cancer Paternal Grandfather        melanoma  . Lung cancer Paternal Grandfather   . Alcohol abuse Maternal Grandmother   . Cancer Maternal Grandfather        Colon    Social History Social History   Tobacco Use  . Smoking status: Never Smoker  . Smokeless tobacco: Never Used  Substance Use Topics  . Alcohol use: Yes    Alcohol/week: 0.0 standard drinks    Comment: Rarely  . Drug use: No    Review of Systems  Constitutional: No fever/chills Eyes: No visual changes. ENT: No sore throat. Cardiovascular: Denies chest pain. Respiratory: Denies shortness of breath. Gastrointestinal: No abdominal pain.  No nausea, no vomiting.  No diarrhea.  No constipation. Genitourinary: Negative for dysuria. Musculoskeletal: Right hand pain.   Skin: Negative for rash. Neurological: Negative for headaches, focal weakness or numbness. Psychiatric:  Anxiety Allergic/Immunilogical: Shellfish and Bactrim. ____________________________________________  PHYSICAL EXAM:  VITAL SIGNS: ED Triage Vitals  Enc Vitals Group     BP 02/06/19 1509 120/84     Pulse Rate 02/06/19 1509 (!) 113     Resp 02/06/19 1509 20     Temp 02/06/19 1509 99 F (37.2 C)     Temp Source 02/06/19 1509 Oral     SpO2 02/06/19 1509 99 %     Weight 02/06/19 1510 125 lb (56.7 kg)     Height 02/06/19 1510 5\' 4"  (1.626 m)     Head Circumference --      Peak Flow --      Pain Score 02/06/19 1510 9     Pain Loc --      Pain Edu? --      Excl. in GC? --     Constitutional: Alert and oriented.  Anxious. Cardiovascular: Normal rate, regular rhythm.  Grossly normal heart sounds.  Good peripheral circulation. Respiratory: Normal respiratory effort.  No retractions. Lungs CTAB. Gastrointestinal: Soft and nontender. No distention. No abdominal bruits. No CVA tenderness. Musculoskeletal: No obvious deformity to the digits of the right hand.  Decreased range of motion with flexion with by complaint of pain.  Neurologic:  Normal speech and language. No gross focal neurologic deficits are appreciated. No gait instability. Skin:  Skin is warm, dry and intact. No rash noted.  No abrasion or ecchymosis. Psychiatric: Mood and affect are normal. Speech and behavior are normal.  ____________________________________________   LABS (all labs ordered are listed, but only abnormal results are displayed)  Labs Reviewed - No data to display ____________________________________________  EKG   ____________________________________________  RADIOLOGY  ED MD interpretation:    Official radiology report(s): DG Hand Complete Right  Result Date: 02/06/2019 CLINICAL DATA:  Metal bar fell on hand EXAM: RIGHT HAND - COMPLETE 3+ VIEW COMPARISON:  None. FINDINGS: Frontal, oblique, and lateral views were obtained. No evident acute fracture or dislocation. Mild remodeling in the distal radius suggests residua from old trauma. Joint spaces appear normal. No erosive change. IMPRESSION: Suspect residua of old trauma in the distal radius with remodeling. No acute fracture or dislocation. No appreciable arthropathy. Electronically Signed   By: 04/06/2019 III M.D.   On: 02/06/2019 15:50    ____________________________________________   PROCEDURES  Procedure(s) performed (including Critical Care):  .Splint Application  Date/Time: 02/06/2019 4:50 PM Performed by: 04/06/2019 Authorized by: Myles Rosenthal, PA-C   Consent:    Consent obtained:  Verbal   Consent given by:  Patient   Risks discussed:  Numbness, pain and swelling Pre-procedure details:     Sensation:  Normal Procedure details:    Laterality:  Right   Location:  Hand   Splint type:  Wrist   Supplies:  Ortho-Glass Post-procedure details:    Pain:  Unchanged   Sensation:  Normal   Patient tolerance of procedure:  Tolerated well, no immediate complications     ____________________________________________   INITIAL IMPRESSION / ASSESSMENT AND PLAN / ED COURSE  As part of my medical decision making, I reviewed the following data within the electronic MEDICAL RECORD NUMBER      Patient presents with right hand pain involving the third and fourth digits secondary to contusion.  Patient is a barbell fell on her hand.   Right hand pain secondary to contusion.  Discussed -3 fracture with patient.  Patient placed in a volar splint and given discharge care instructions.  Take medication as directed follow-up PCP as needed.  Nicole Richardson was evaluated in Emergency Department on 02/06/2019 for the symptoms described in the history of present illness. She was evaluated in the context of the global COVID-19 pandemic, which necessitated consideration that the patient might be at risk for infection with the SARS-CoV-2 virus that causes COVID-19. Institutional protocols and algorithms that pertain to the evaluation of patients at risk for COVID-19 are in a state of rapid change based on information released by regulatory bodies including the CDC and federal and state organizations. These policies and algorithms were followed during the patient's care in the ED.    ____________________________________________   FINAL CLINICAL IMPRESSION(S) / ED DIAGNOSES  Final diagnoses:  Hand pain, right  Contusion of right hand, initial encounter     ED Discharge Orders         Ordered    traMADol (ULTRAM) 50 MG tablet  Every 6 hours PRN     02/06/19 1649    ibuprofen (ADVIL) 600 MG tablet  Every 8 hours PRN     02/06/19 1649           Note:  This document was prepared using Dragon  voice recognition software and may include unintentional dictation errors.    Sable Feil, PA-C 02/06/19 1655    Lilia Pro., MD 02/06/19 2012

## 2019-02-06 NOTE — ED Notes (Signed)
Pt had  injury from steel bars at [employer] "crushing" ( per pt) right ring and middle fingers. Fingers swollen. Hand elevated, ice applied, no jewelry present.

## 2019-03-25 ENCOUNTER — Other Ambulatory Visit: Payer: Self-pay | Admitting: Adult Health

## 2019-03-25 ENCOUNTER — Telehealth: Payer: Self-pay

## 2019-03-25 DIAGNOSIS — F411 Generalized anxiety disorder: Secondary | ICD-10-CM

## 2019-03-25 NOTE — Telephone Encounter (Signed)
LMOM FOR PATIENT TO CALL AND SCHEDULE A ROUTINE FU. HAS NOT BEEN SEEN SINCE October 2020. REQUESTED A REFILL ON ZOLOFT AND SENT A 30 DAY PRESCRIPTION OVER BUT DOES NEED TO SCHEDULE AN APPT.

## 2019-04-12 ENCOUNTER — Telehealth: Payer: Self-pay

## 2019-04-12 NOTE — Telephone Encounter (Signed)
Left a message and asked pt to call back and schedule appointment to be seen for refills. Beth

## 2019-04-13 ENCOUNTER — Telehealth: Payer: Self-pay

## 2019-04-13 NOTE — Telephone Encounter (Signed)
Denied sertraline pt need appt for refills and also lmom to pt to call back for schedule appt

## 2019-06-02 ENCOUNTER — Emergency Department
Admission: EM | Admit: 2019-06-02 | Discharge: 2019-06-02 | Disposition: A | Discharge status: Home / Self Care | Payer: BLUE CROSS/BLUE SHIELD

## 2019-06-02 NOTE — ED Notes (Signed)
Pt approached registration desk ambualted independently with a steady gait. Pt st"if the pain gets worse I'll come back".  NAd noted by this RN.

## 2019-06-03 ENCOUNTER — Other Ambulatory Visit: Payer: Self-pay

## 2019-06-03 ENCOUNTER — Emergency Department
Admission: EM | Admit: 2019-06-03 | Discharge: 2019-06-03 | Disposition: A | Discharge status: Home / Self Care | Payer: BC Managed Care – PPO | Attending: Emergency Medicine | Admitting: Emergency Medicine

## 2019-06-03 ENCOUNTER — Emergency Department: Payer: BC Managed Care – PPO

## 2019-06-03 DIAGNOSIS — R102 Pelvic and perineal pain: Secondary | ICD-10-CM | POA: Diagnosis not present

## 2019-06-03 DIAGNOSIS — Z79899 Other long term (current) drug therapy: Secondary | ICD-10-CM | POA: Insufficient documentation

## 2019-06-03 DIAGNOSIS — R1031 Right lower quadrant pain: Secondary | ICD-10-CM | POA: Diagnosis present

## 2019-06-03 LAB — URINALYSIS, COMPLETE (UACMP) WITH MICROSCOPIC
Bacteria, UA: NONE SEEN
Bilirubin Urine: NEGATIVE
Glucose, UA: NEGATIVE mg/dL
Hgb urine dipstick: NEGATIVE
Ketones, ur: NEGATIVE mg/dL
Nitrite: NEGATIVE
Protein, ur: NEGATIVE mg/dL
Specific Gravity, Urine: 1.017 (ref 1.005–1.030)
pH: 5 (ref 5.0–8.0)

## 2019-06-03 LAB — CBC
HCT: 39.1 % (ref 36.0–46.0)
Hemoglobin: 13.4 g/dL (ref 12.0–15.0)
MCH: 31.4 pg (ref 26.0–34.0)
MCHC: 34.3 g/dL (ref 30.0–36.0)
MCV: 91.6 fL (ref 80.0–100.0)
Platelets: 250 10*3/uL (ref 150–400)
RBC: 4.27 MIL/uL (ref 3.87–5.11)
RDW: 12.2 % (ref 11.5–15.5)
WBC: 6.6 10*3/uL (ref 4.0–10.5)
nRBC: 0 % (ref 0.0–0.2)

## 2019-06-03 LAB — COMPREHENSIVE METABOLIC PANEL
ALT: 17 U/L (ref 0–44)
AST: 19 U/L (ref 15–41)
Albumin: 4.6 g/dL (ref 3.5–5.0)
Alkaline Phosphatase: 47 U/L (ref 38–126)
Anion gap: 6 (ref 5–15)
BUN: 9 mg/dL (ref 6–20)
CO2: 25 mmol/L (ref 22–32)
Calcium: 9.1 mg/dL (ref 8.9–10.3)
Chloride: 106 mmol/L (ref 98–111)
Creatinine, Ser: 0.99 mg/dL (ref 0.44–1.00)
GFR calc Af Amer: >60 mL/min (ref >60)
GFR calc non Af Amer: >60 mL/min (ref >60)
Glucose, Bld: 92 mg/dL (ref 70–99)
Potassium: 4.4 mmol/L (ref 3.5–5.1)
Sodium: 137 mmol/L (ref 135–145)
Total Bilirubin: 0.8 mg/dL (ref 0.3–1.2)
Total Protein: 7.7 g/dL (ref 6.5–8.1)

## 2019-06-03 LAB — POCT PREGNANCY, URINE: Preg Test, Ur: NEGATIVE

## 2019-06-03 LAB — LIPASE, BLOOD: Lipase: 32 U/L (ref 11–51)

## 2019-06-03 LAB — PREGNANCY, URINE: Preg Test, Ur: NEGATIVE

## 2019-06-03 MED ORDER — OXYCODONE HCL 5 MG PO TABS
5.0000 mg | ORAL_TABLET | Freq: Once | ORAL | Status: AC
  Administered 2019-06-03: 5 mg via ORAL
  Filled 2019-06-03: qty 1

## 2019-06-03 MED ORDER — IOHEXOL 300 MG/ML  SOLN
75.0000 mL | Freq: Once | INTRAMUSCULAR | Status: AC | PRN
  Administered 2019-06-03: 75 mL via INTRAVENOUS
  Filled 2019-06-03: qty 75

## 2019-06-03 NOTE — ED Notes (Signed)
Pt with RLQ pain for 3 days that has gotten worse today. Pt states some rebound tenderness at present, and diarrhea yesterday.

## 2019-06-03 NOTE — ED Triage Notes (Signed)
Pt started having abdominal pain in the RLQ about 3 days ago that has consistently been getting worse- pt states she went to urgent care who told her to come here for concerns of appendicitis

## 2019-06-03 NOTE — ED Provider Notes (Signed)
[facility] Emergency Department Provider Note ____________________________________________   First MD Initiated Contact with Patient 06/03/19 1524     (approximate)  I have reviewed the triage vital signs and the nursing notes.   HISTORY  Chief Complaint Abdominal Pain  HPI Nicole Richardson is a 29 y.o. female who presents to the emergency department for treatment and evaluation of RLQ abdominal pain that started 3 days ago and has progressively worsened today. No fever. Normal appetite.      Past Medical History:  Diagnosis Date  . Acid reflux   . Anxiety     Patient Active Problem List   Diagnosis Date Noted  . Acute pain of left shoulder 12/09/2018  . Dysfunctional gallbladder 06/24/2012  . Acid reflux   . Periods, menstrual, difficult 08/08/2011    Past Surgical History:  Procedure Laterality Date  . ADENOIDECTOMY    . Broken arm    . CHOLECYSTECTOMY    . WISDOM TOOTH EXTRACTION      Prior to Admission medications   Medication Sig Start Date End Date Taking? Authorizing Provider  Cetirizine HCl (ZYRTEC PO) Take by mouth.    [provider]  esomeprazole (NEXIUM) 20 MG capsule Take 20 mg by mouth daily at 12 noon.    [provider]  ibuprofen (ADVIL) 600 MG tablet Take 1 tablet (600 mg total) by mouth every 8 (eight) hours as needed. 02/06/19   Sable Feil, PA-C  meloxicam (MOBIC) 7.5 MG tablet Take 1 tablet (7.5 mg total) by mouth daily. 11/24/18   Ronnell Freshwater, NP  norethindrone-ethinyl estradiol (JUNEL 1/20) 1-20 MG-MCG tablet Take 1 tablet by mouth daily. 11/05/18   Fontaine, Belinda Block, MD  predniSONE (STERAPRED UNI-PAK 21 TAB) 10 MG (21) TBPK tablet 6 day taper - take by mouth as directed for 6 days 11/24/18   Ronnell Freshwater, NP  sertraline (ZOLOFT) 50 MG tablet TAKE 1 TABLET BY MOUTH EVERY DAY 03/25/19   Kendell Bane, NP  traMADol (ULTRAM) 50 MG tablet Take 1 tablet (50 mg total) by mouth every 6 (six)  hours as needed. 02/06/19 02/06/20  Sable Feil, PA-C    Allergies Otho Darner allergy] and Bactrim [sulfamethoxazole-trimethoprim]  Family History  Problem Relation Age of Onset  . Cancer Paternal Grandfather        melanoma  . Lung cancer Paternal Grandfather   . Alcohol abuse Maternal Grandmother   . Cancer Maternal Grandfather        Colon    Social History Social History   Tobacco Use  . Smoking status: Never Smoker  . Smokeless tobacco: Never Used  Substance Use Topics  . Alcohol use: Yes    Alcohol/week: 0.0 standard drinks    Comment: Rarely  . Drug use: No    Review of Systems  Constitutional: No fever/chills Eyes: No visual changes. ENT: No sore throat. Cardiovascular: Denies chest pain. Respiratory: Denies shortness of breath. Gastrointestinal: Positive abdominal pain.  No nausea, no vomiting.  No diarrhea.  No constipation. Genitourinary: Negative for dysuria. Musculoskeletal: Negative for back pain. Skin: Negative for rash. Neurological: Negative for headaches, focal weakness or numbness. ____________________________________________   PHYSICAL EXAM:  VITAL SIGNS: ED Triage Vitals  Enc Vitals Group     BP 06/03/19 1259 112/83     Pulse Rate 06/03/19 1259 100     Resp 06/03/19 1259 18     Temp 06/03/19 1259 99 F (37.2 C)     Temp  Source 06/03/19 1259 Oral     SpO2 06/03/19 1259 100 %     Weight 06/03/19 1259 125 lb (56.7 kg)     Height 06/03/19 1259 5\' 4"  (1.626 m)     Head Circumference --      Peak Flow --      Pain Score 06/03/19 1314 5     Pain Loc --      Pain Edu? --      Excl. in GC? --     Constitutional: Alert and oriented. Well appearing and in no acute distress. Eyes: Conjunctivae are normal. PERRL. EOMI. Head: Atraumatic. Nose: No congestion/rhinnorhea. Mouth/Throat: Mucous membranes are moist.  Oropharynx non-erythematous. Neck: No stridor.   Hematological/Lymphatic/Immunilogical: No cervical  lymphadenopathy. Cardiovascular: Normal rate, regular rhythm. Grossly normal heart sounds.  Good peripheral circulation. Respiratory: Normal respiratory effort.  No retractions. Lungs CTAB. Gastrointestinal: Soft. Tender in RLQ. No distention. No abdominal bruits. No CVA tenderness. Genitourinary:  Musculoskeletal: No lower extremity tenderness nor edema. Neurologic:  Normal speech and language. No gross focal neurologic deficits are appreciated. No gait instability. Skin:  Skin is warm, dry and intact. No rash noted. Psychiatric: Mood and affect are normal. Speech and behavior are normal.  ____________________________________________   LABS (all labs ordered are listed, but only abnormal results are displayed)  Labs Reviewed  URINALYSIS, COMPLETE (UACMP) WITH MICROSCOPIC - Abnormal; Notable for the following components:      Result Value   Color, Urine YELLOW (*)    APPearance HAZY (*)    Leukocytes,Ua SMALL (*)    All other components within normal limits  URINE CULTURE  LIPASE, BLOOD  COMPREHENSIVE METABOLIC PANEL  CBC  PREGNANCY, URINE  POC URINE PREG, ED  POCT PREGNANCY, URINE   ____________________________________________  EKG  Not indicated. ____________________________________________  RADIOLOGY  ED MD interpretation:    CT the abdomen and pelvis with contrast shows no findings to account for reported symptoms.  She does have some small volume free fluid in the pelvis.  Official radiology report(s): CT ABDOMEN PELVIS W CONTRAST  Result Date: 06/03/2019 CLINICAL DATA:  Right lower quadrant pain EXAM: CT ABDOMEN AND PELVIS WITH CONTRAST TECHNIQUE: Multidetector CT imaging of the abdomen and pelvis was performed using the standard protocol following bolus administration of intravenous contrast. CONTRAST:  37mL OMNIPAQUE IOHEXOL 300 MG/ML  SOLN COMPARISON:  None. FINDINGS: Lower chest: No acute abnormality. Hepatobiliary: No focal liver abnormality is seen. Status  post cholecystectomy. No unexpected biliary dilatation. Pancreas: Unremarkable Spleen: Unremarkable. Adrenals/Urinary Tract: Adrenals, kidneys, and bladder are unremarkable. Stomach/Bowel: Bowel is normal in caliber. Stomach is within normal limits. Probable appendix appears normal. Vascular/Lymphatic: No significant vascular findings are present. No enlarged abdominal or pelvic lymph nodes. Reproductive: Uterus and bilateral adnexa are unremarkable. Other: Likely physiologic small volume free fluid in the pelvis. Musculoskeletal: No acute osseous abnormality. IMPRESSION: No findings to account for reported symptoms. Electronically Signed   By: 74m M.D.   On: 06/03/2019 17:25    ____________________________________________   PROCEDURES  Procedure(s) performed (including Critical Care):  Procedures  ____________________________________________   INITIAL IMPRESSION / ASSESSMENT AND PLAN     29 year old female presenting to the emergency department for treatment and evaluation of right lower quadrant pain that has been present for the past 3 days.  See HPI for further details.  Plan will be to get a CT of the abdomen and pelvis to ensure that she is not currently experiencing acute appendicitis.  DIFFERENTIAL DIAGNOSIS  Acute appendicitis,  ovarian cyst, colitis  ED COURSE  Lab results, urinalysis results, as well as CT results are all reassuring.  No indication of appendicitis or other acute concerns.  There is a small amount of free fluid in the pelvis which may have represented a ruptured ovarian cyst as she has experienced this in the past.  She declined prescription pain medicine.  She was encouraged to follow-up with gynecology for symptoms that are not improving over the next couple of days.  She is to return to the emergency department for symptoms of change or worsen if she is unable to see gynecology or primary care. ____________________________________________   FINAL  CLINICAL IMPRESSION(S) / ED DIAGNOSES  Final diagnoses:  Pelvic pain in female     ED Discharge Orders    None       SHAMARRA WARDA was evaluated in Emergency Department on 06/03/2019 for the symptoms described in the history of present illness. She was evaluated in the context of the global COVID-19 pandemic, which necessitated consideration that the patient might be at risk for infection with the SARS-CoV-2 virus that causes COVID-19. Institutional protocols and algorithms that pertain to the evaluation of patients at risk for COVID-19 are in a state of rapid change based on information released by regulatory bodies including the CDC and federal and state organizations. These policies and algorithms were followed during the patient's care in the ED.   Note:  This document was prepared using Dragon voice recognition software and may include unintentional dictation errors.   Chinita Pester, FNP 06/03/19 2359    Emily Filbert, MD 06/04/19 1459

## 2019-06-03 NOTE — Discharge Instructions (Signed)
Please follow-up with the gynecologist for symptoms that are not improving over the next couple of days.  Return to the emergency department for symptoms of change or worsen if you are unable to schedule an appointment.

## 2019-06-05 LAB — URINE CULTURE: Culture: NO GROWTH

## 2021-10-27 IMAGING — CT CT ABD-PELV W/ CM
2 of 4 series · 16 of 46 positions shown, 18 images · IV contrast (APPLIED)
Comparison: None.

CLINICAL DATA: Right lower quadrant pain

EXAM:
CT ABDOMEN AND PELVIS WITH CONTRAST
TECHNIQUE: Multidetector CT imaging of the abdomen and pelvis was performed
using the standard protocol following bolus administration of
intravenous contrast.
CONTRAST:  75mL OMNIPAQUE IOHEXOL 300 MG/ML  SOLN

[Series 2: axial st · axial · 0.69mm/px · z∈[-382,-2]mm · 13 of 84 slices shown, 15 images]
[im 4/84  soft-tissue]
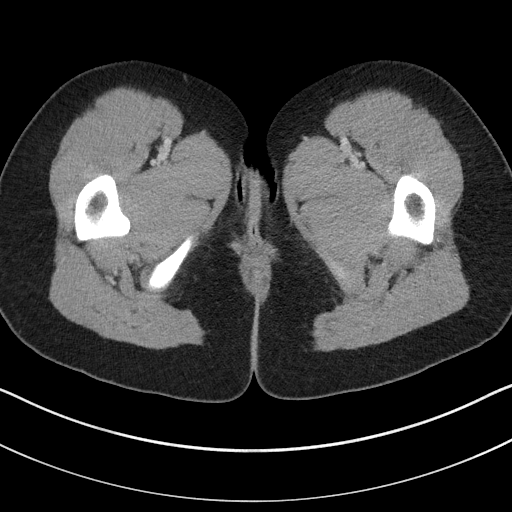
[im 4/84  bone]
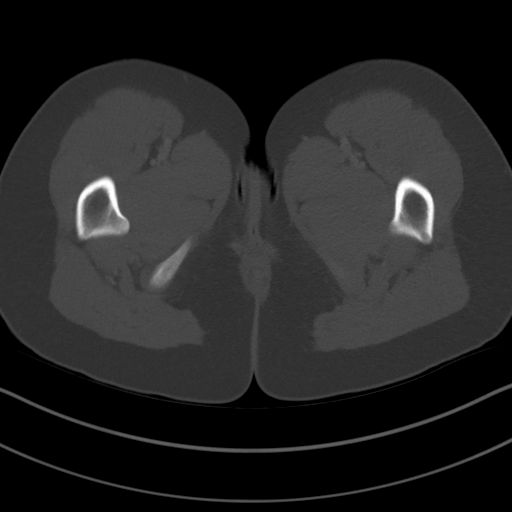
[im 10/84  soft-tissue]
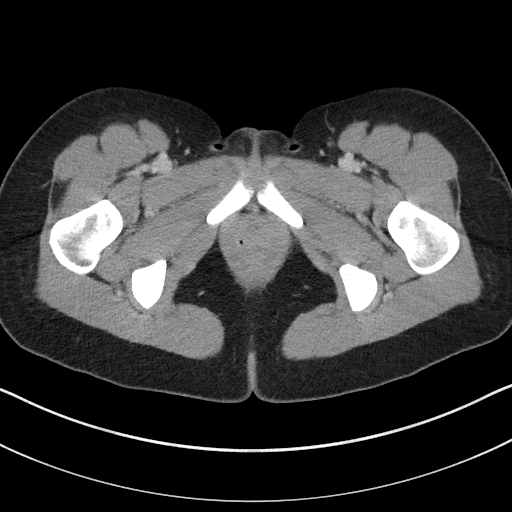
[im 16/84  soft-tissue]
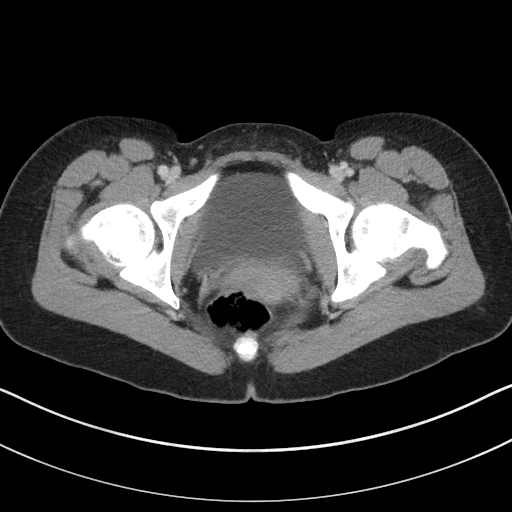
[im 23/84  soft-tissue]
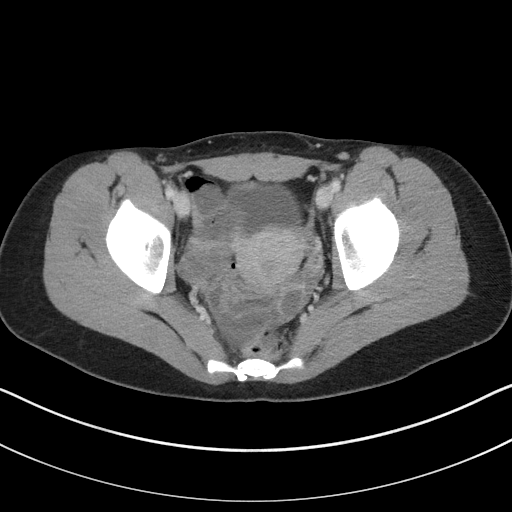
[im 29/84  soft-tissue]
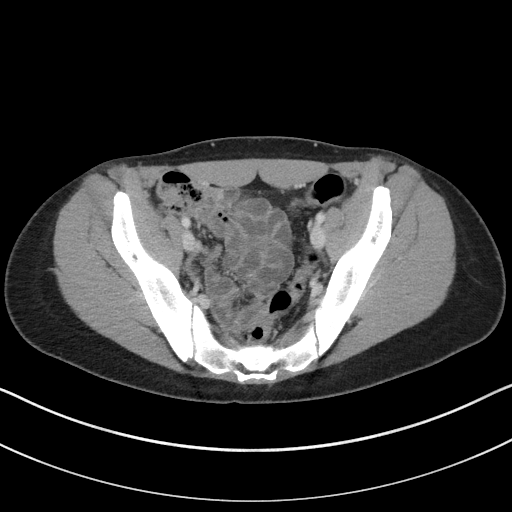
[im 36/84  soft-tissue]
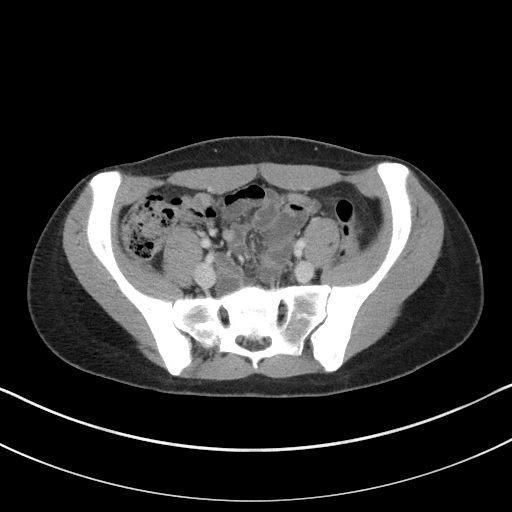
[im 42/84  soft-tissue]
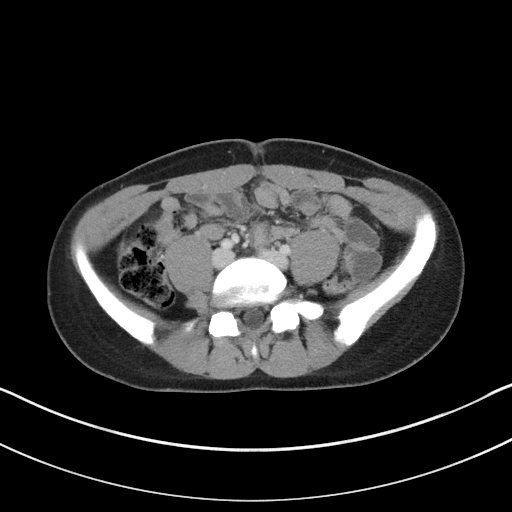
[im 48/84  soft-tissue]
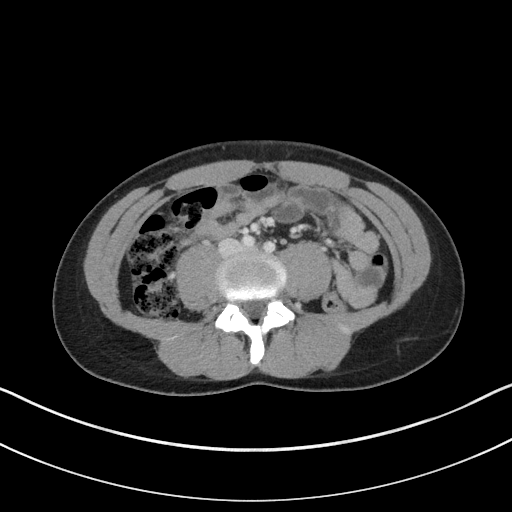
[im 55/84  soft-tissue]
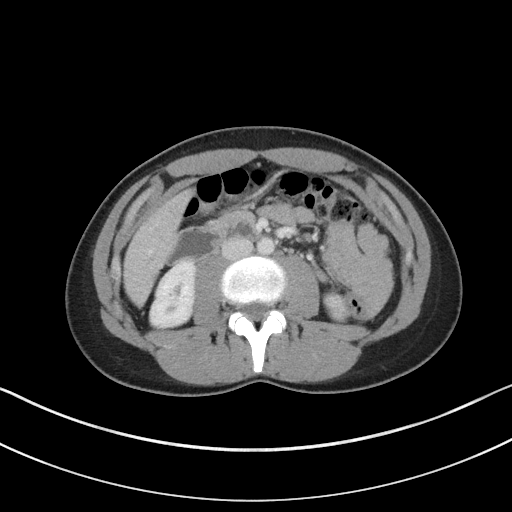
[im 55/84  bone]
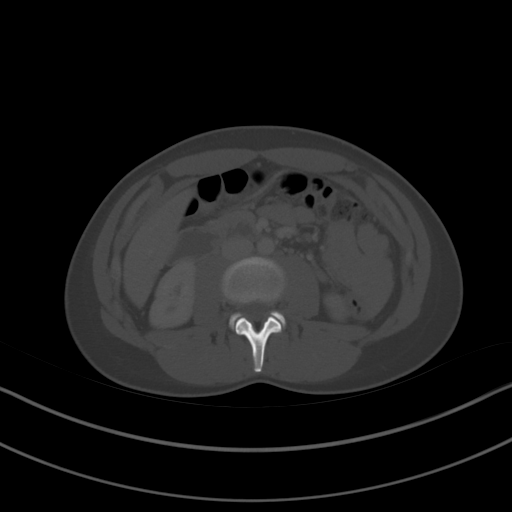
[im 61/84  soft-tissue]
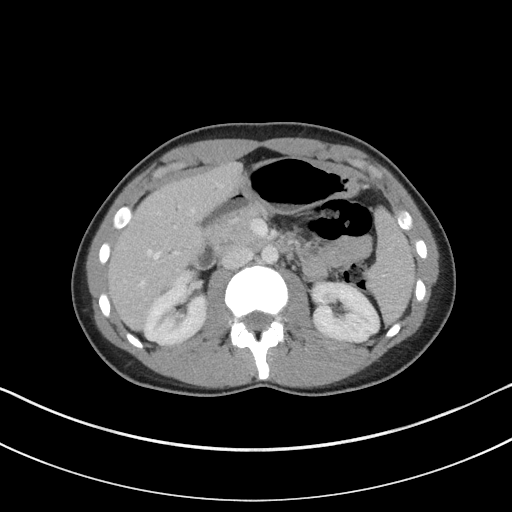
[im 68/84  soft-tissue]
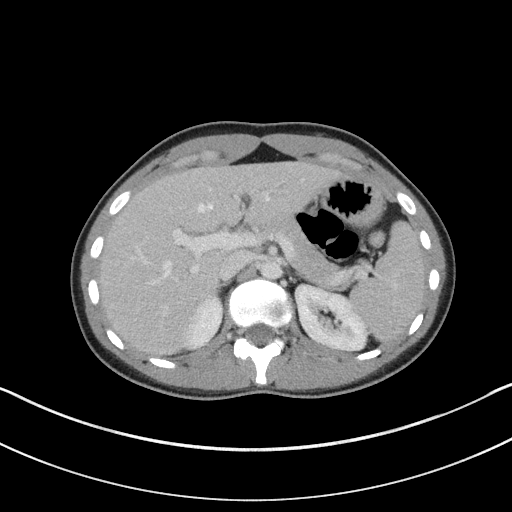
[im 74/84  soft-tissue]
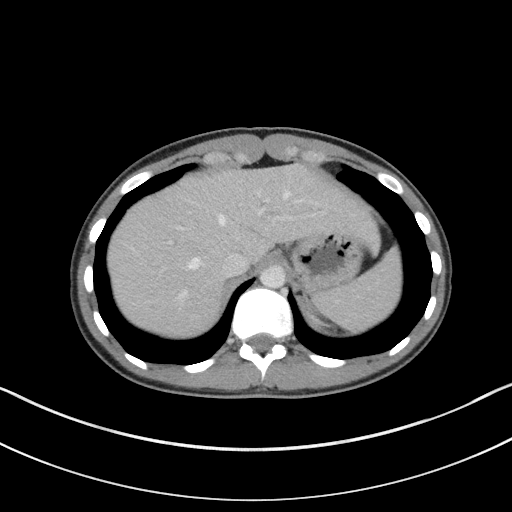
[im 80/84  soft-tissue]
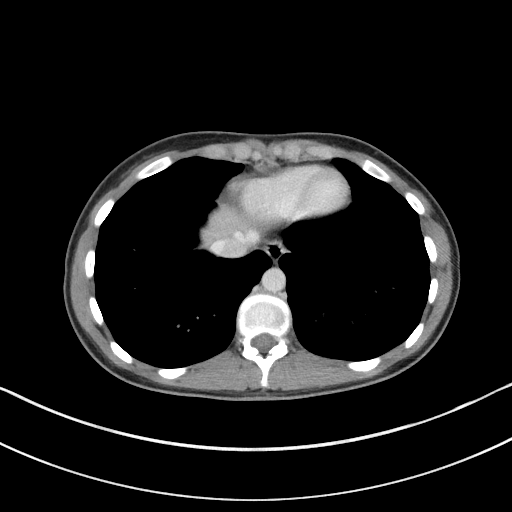

[Series 5: coronal st · coronal · 0.63mm/px · 3 of 63 slices shown]
[im 21/63  soft-tissue]
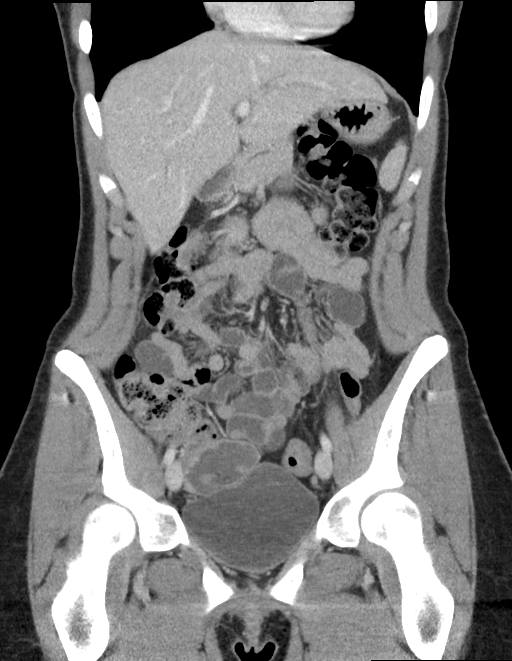
[im 28/63  soft-tissue]
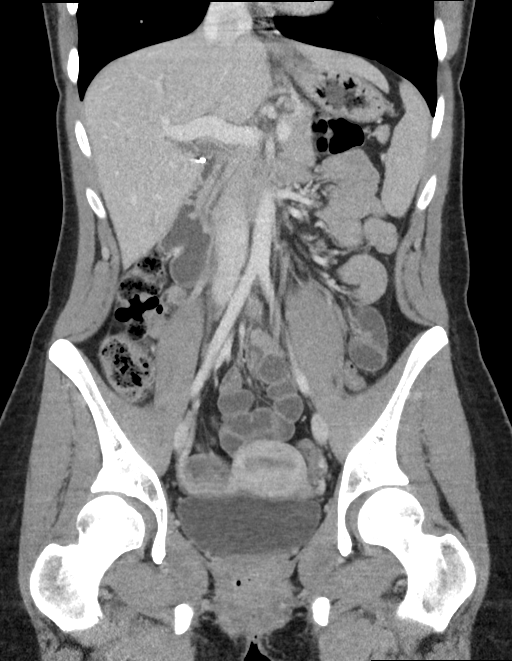
[im 35/63  soft-tissue]
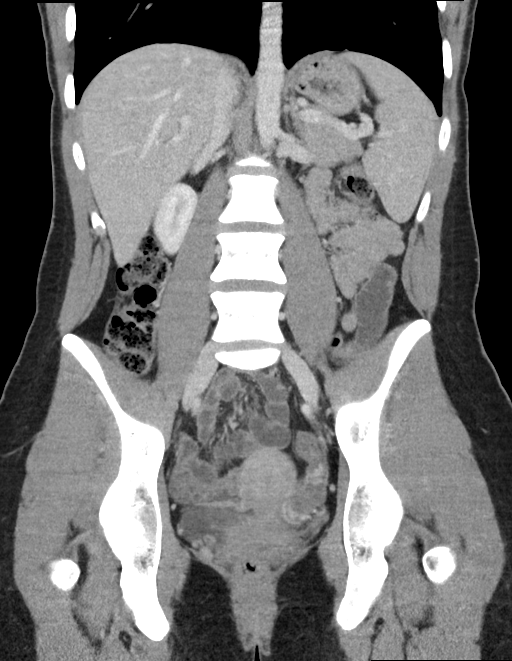

[16 of 46 positions shown; findings below may reference images not displayed]

FINDINGS: Lower chest: No acute abnormality.

Hepatobiliary: No focal liver abnormality is seen. Status post
cholecystectomy. No unexpected biliary dilatation.

Pancreas: Unremarkable

Spleen: Unremarkable.

Adrenals/Urinary Tract: Adrenals, kidneys, and bladder are
unremarkable.

Stomach/Bowel: Bowel is normal in caliber. Stomach is within normal
limits. Probable appendix appears normal.

Vascular/Lymphatic: No significant vascular findings are present. No
enlarged abdominal or pelvic lymph nodes.

Reproductive: Uterus and bilateral adnexa are unremarkable.

Other: Likely physiologic small volume free fluid in the pelvis.

Musculoskeletal: No acute osseous abnormality.
IMPRESSION: No findings to account for reported symptoms.
